# Patient Record
Sex: Male | Born: 1975 | Race: White | Hispanic: No | Marital: Married | Smoking: Current every day smoker
Health system: Southern US, Community
[De-identification: ages and names within clinical notes are randomized; demographics above are authoritative.]

## PROBLEM LIST (undated history)

## (undated) DIAGNOSIS — K219 Gastro-esophageal reflux disease without esophagitis: Secondary | ICD-10-CM

## (undated) DIAGNOSIS — J42 Unspecified chronic bronchitis: Secondary | ICD-10-CM

## (undated) DIAGNOSIS — M109 Gout, unspecified: Secondary | ICD-10-CM

## (undated) HISTORY — DX: Unspecified chronic bronchitis: J42

## (undated) HISTORY — PX: BACK SURGERY: SHX140

---

## 2006-09-09 ENCOUNTER — Emergency Department: Payer: Self-pay | Admitting: Emergency Medicine

## 2008-03-26 ENCOUNTER — Ambulatory Visit: Payer: Self-pay | Admitting: Family Medicine

## 2008-04-27 ENCOUNTER — Ambulatory Visit: Payer: Self-pay | Admitting: Internal Medicine

## 2008-09-26 ENCOUNTER — Ambulatory Visit: Payer: Self-pay | Admitting: Internal Medicine

## 2009-06-01 ENCOUNTER — Ambulatory Visit: Payer: Self-pay | Admitting: Internal Medicine

## 2010-05-11 ENCOUNTER — Ambulatory Visit: Payer: Self-pay | Admitting: Internal Medicine

## 2010-05-16 ENCOUNTER — Ambulatory Visit: Payer: Self-pay | Admitting: Internal Medicine

## 2010-05-18 ENCOUNTER — Ambulatory Visit: Payer: Self-pay | Admitting: Internal Medicine

## 2010-05-23 ENCOUNTER — Ambulatory Visit: Payer: Self-pay | Admitting: Family Medicine

## 2010-06-27 ENCOUNTER — Ambulatory Visit: Payer: Self-pay | Admitting: Internal Medicine

## 2010-06-29 ENCOUNTER — Ambulatory Visit: Payer: Self-pay | Admitting: Internal Medicine

## 2010-07-21 ENCOUNTER — Ambulatory Visit: Payer: Self-pay

## 2010-08-01 ENCOUNTER — Ambulatory Visit: Payer: Self-pay | Admitting: Unknown Physician Specialty

## 2010-08-03 ENCOUNTER — Ambulatory Visit: Payer: Self-pay | Admitting: Unknown Physician Specialty

## 2011-01-27 ENCOUNTER — Ambulatory Visit: Payer: Self-pay

## 2011-06-18 ENCOUNTER — Ambulatory Visit: Payer: Self-pay | Admitting: Unknown Physician Specialty

## 2011-09-29 ENCOUNTER — Ambulatory Visit: Payer: Self-pay | Admitting: Emergency Medicine

## 2011-12-12 ENCOUNTER — Ambulatory Visit: Payer: Self-pay | Admitting: Anesthesiology

## 2011-12-24 ENCOUNTER — Ambulatory Visit: Payer: Self-pay | Admitting: Anesthesiology

## 2012-01-21 ENCOUNTER — Ambulatory Visit: Payer: Self-pay | Admitting: Anesthesiology

## 2012-02-19 ENCOUNTER — Ambulatory Visit: Payer: Self-pay | Admitting: Anesthesiology

## 2012-04-16 ENCOUNTER — Ambulatory Visit: Payer: Self-pay | Admitting: Anesthesiology

## 2012-06-27 ENCOUNTER — Ambulatory Visit: Payer: Self-pay | Admitting: Family Medicine

## 2012-06-28 ENCOUNTER — Emergency Department: Payer: Self-pay | Admitting: Emergency Medicine

## 2012-09-28 ENCOUNTER — Ambulatory Visit: Payer: Self-pay | Admitting: Family Medicine

## 2012-09-28 LAB — URIC ACID: Uric Acid: 9.6 mg/dL — ABNORMAL HIGH (ref 3.5–7.2)

## 2013-03-20 ENCOUNTER — Ambulatory Visit: Payer: Self-pay | Admitting: Unknown Physician Specialty

## 2014-03-20 ENCOUNTER — Ambulatory Visit: Payer: Self-pay | Admitting: Physician Assistant

## 2014-03-20 LAB — CBC WITH DIFFERENTIAL/PLATELET
Basophil #: 0.1 10*3/uL (ref 0.0–0.1)
Basophil %: 1 %
Eosinophil #: 0.5 10*3/uL (ref 0.0–0.7)
Eosinophil %: 4.5 %
HCT: 46 % (ref 40.0–52.0)
HGB: 15.6 g/dL (ref 13.0–18.0)
Lymphocyte #: 1.8 10*3/uL (ref 1.0–3.6)
Lymphocyte %: 17.5 %
MCH: 31.8 pg (ref 26.0–34.0)
MCHC: 33.9 g/dL (ref 32.0–36.0)
MCV: 94 fL (ref 80–100)
Monocyte #: 0.6 x10 3/mm (ref 0.2–1.0)
Monocyte %: 6 %
Neutrophil #: 7.3 10*3/uL — ABNORMAL HIGH (ref 1.4–6.5)
Neutrophil %: 71 %
Platelet: 259 10*3/uL (ref 150–440)
RBC: 4.91 10*6/uL (ref 4.40–5.90)
RDW: 13 % (ref 11.5–14.5)
WBC: 10.2 10*3/uL (ref 3.8–10.6)

## 2014-03-20 LAB — URIC ACID: Uric Acid: 9.1 mg/dL — ABNORMAL HIGH (ref 3.5–7.2)

## 2014-09-20 ENCOUNTER — Other Ambulatory Visit: Payer: Self-pay | Admitting: Orthopedic Surgery

## 2014-09-20 DIAGNOSIS — M5442 Lumbago with sciatica, left side: Secondary | ICD-10-CM

## 2014-09-23 ENCOUNTER — Ambulatory Visit
Admission: RE | Admit: 2014-09-23 | Discharge: 2014-09-23 | Disposition: A | Discharge status: Home / Self Care | Payer: BLUE CROSS/BLUE SHIELD | Source: Ambulatory Visit | Attending: Orthopedic Surgery | Admitting: Orthopedic Surgery

## 2014-09-23 DIAGNOSIS — G544 Lumbosacral root disorders, not elsewhere classified: Secondary | ICD-10-CM | POA: Diagnosis not present

## 2014-09-23 DIAGNOSIS — M5126 Other intervertebral disc displacement, lumbar region: Secondary | ICD-10-CM | POA: Insufficient documentation

## 2014-09-23 DIAGNOSIS — M5432 Sciatica, left side: Secondary | ICD-10-CM | POA: Diagnosis present

## 2014-09-23 DIAGNOSIS — M545 Low back pain: Secondary | ICD-10-CM | POA: Diagnosis present

## 2014-09-23 DIAGNOSIS — M5127 Other intervertebral disc displacement, lumbosacral region: Secondary | ICD-10-CM | POA: Insufficient documentation

## 2014-09-23 DIAGNOSIS — M5442 Lumbago with sciatica, left side: Secondary | ICD-10-CM

## 2014-09-29 ENCOUNTER — Other Ambulatory Visit: Payer: Self-pay | Admitting: Orthopedic Surgery

## 2014-09-29 DIAGNOSIS — M5127 Other intervertebral disc displacement, lumbosacral region: Secondary | ICD-10-CM

## 2014-09-29 DIAGNOSIS — M5442 Lumbago with sciatica, left side: Secondary | ICD-10-CM

## 2014-10-01 ENCOUNTER — Other Ambulatory Visit: Payer: Self-pay

## 2014-10-01 ENCOUNTER — Ambulatory Visit
Admission: RE | Admit: 2014-10-01 | Discharge: 2014-10-01 | Disposition: A | Discharge status: Home / Self Care | Payer: BLUE CROSS/BLUE SHIELD | Source: Ambulatory Visit | Attending: Orthopedic Surgery | Admitting: Orthopedic Surgery

## 2014-10-01 ENCOUNTER — Other Ambulatory Visit: Payer: Self-pay | Admitting: Orthopedic Surgery

## 2014-10-01 VITALS — BP 124/81 | HR 65

## 2014-10-01 DIAGNOSIS — M5126 Other intervertebral disc displacement, lumbar region: Secondary | ICD-10-CM | POA: Insufficient documentation

## 2014-10-01 DIAGNOSIS — M5442 Lumbago with sciatica, left side: Secondary | ICD-10-CM

## 2014-10-01 DIAGNOSIS — Z720 Tobacco use: Secondary | ICD-10-CM

## 2014-10-01 DIAGNOSIS — F17200 Nicotine dependence, unspecified, uncomplicated: Secondary | ICD-10-CM | POA: Insufficient documentation

## 2014-10-01 DIAGNOSIS — M5127 Other intervertebral disc displacement, lumbosacral region: Secondary | ICD-10-CM

## 2014-10-01 DIAGNOSIS — M5136 Other intervertebral disc degeneration, lumbar region: Secondary | ICD-10-CM

## 2014-10-01 MED ORDER — IOHEXOL 180 MG/ML  SOLN
1.0000 mL | Freq: Once | INTRAMUSCULAR | Status: AC | PRN
  Administered 2014-10-01: 1 mL via EPIDURAL

## 2014-10-01 MED ORDER — METHYLPREDNISOLONE ACETATE 40 MG/ML INJ SUSP (RADIOLOG
120.0000 mg | Freq: Once | INTRAMUSCULAR | Status: AC
  Administered 2014-10-01: 120 mg via EPIDURAL

## 2014-10-01 NOTE — Discharge Instructions (Signed)

## 2014-12-13 ENCOUNTER — Ambulatory Visit
Admission: EM | Admit: 2014-12-13 | Discharge: 2014-12-13 | Disposition: A | Discharge status: Home / Self Care | Payer: BLUE CROSS/BLUE SHIELD | Attending: Registered Nurse | Admitting: Registered Nurse

## 2014-12-13 DIAGNOSIS — M10071 Idiopathic gout, right ankle and foot: Secondary | ICD-10-CM

## 2014-12-13 DIAGNOSIS — M109 Gout, unspecified: Secondary | ICD-10-CM

## 2014-12-13 HISTORY — DX: Gout, unspecified: M10.9

## 2014-12-13 MED ORDER — HYDROCODONE-ACETAMINOPHEN 5-325 MG PO TABS: 1.0000 | ORAL_TABLET | Freq: Every evening | ORAL | Status: DC | PRN

## 2014-12-13 MED ORDER — INDOMETHACIN 50 MG PO CAPS: 50.0000 mg | ORAL_CAPSULE | Freq: Three times a day (TID) | ORAL | Status: AC

## 2014-12-13 NOTE — Discharge Instructions (Signed)
Gout Gout is an inflammatory arthritis caused by a buildup of uric acid crystals in the joints. Uric acid is a chemical that is normally present in the blood. When the level of uric acid in the blood is too high it can form crystals that deposit in your joints and tissues. This causes joint redness, soreness, and swelling (inflammation). Repeat attacks are common. Over time, uric acid crystals can form into masses (tophi) near a joint, destroying bone and causing disfigurement. Gout is treatable and often preventable. CAUSES  The disease begins with elevated levels of uric acid in the blood. Uric acid is produced by your body when it breaks down a naturally found substance called purines. Certain foods you eat, such as meats and fish, contain high amounts of purines. Causes of an elevated uric acid level include:  Being passed down from parent to child (heredity).  Diseases that cause increased uric acid production (such as obesity, psoriasis, and certain cancers).  Excessive alcohol use.  Diet, especially diets rich in meat and seafood.  Medicines, including certain cancer-fighting medicines (chemotherapy), water pills (diuretics), and aspirin.  Chronic kidney disease. The kidneys are no longer able to remove uric acid well.  Problems with metabolism. Conditions strongly associated with gout include:  Obesity.  High blood pressure.  High cholesterol.  Diabetes. Not everyone with elevated uric acid levels gets gout. It is not understood why some people get gout and others do not. Surgery, joint injury, and eating too much of certain foods are some of the factors that can lead to gout attacks. SYMPTOMS   An attack of gout comes on quickly. It causes intense pain with redness, swelling, and warmth in a joint.  Fever can occur.  Often, only one joint is involved. Certain joints are more commonly involved:  Base of the big toe.  Knee.  Ankle.  Wrist.  Finger. Without  treatment, an attack usually goes away in a few days to weeks. Between attacks, you usually will not have symptoms, which is different from many other forms of arthritis. DIAGNOSIS  Your caregiver will suspect gout based on your symptoms and exam. In some cases, tests may be recommended. The tests may include:  Blood tests.  Urine tests.  X-rays.  Joint fluid exam. This exam requires a needle to remove fluid from the joint (arthrocentesis). Using a microscope, gout is confirmed when uric acid crystals are seen in the joint fluid. TREATMENT  There are two phases to gout treatment: treating the sudden onset (acute) attack and preventing attacks (prophylaxis).  Treatment of an Acute Attack.  Medicines are used. These include anti-inflammatory medicines or steroid medicines.  An injection of steroid medicine into the affected joint is sometimes necessary.  The painful joint is rested. Movement can worsen the arthritis.  You may use warm or cold treatments on painful joints, depending which works best for you.  Treatment to Prevent Attacks.  If you suffer from frequent gout attacks, your caregiver may advise preventive medicine. These medicines are started after the acute attack subsides. These medicines either help your kidneys eliminate uric acid from your body or decrease your uric acid production. You may need to stay on these medicines for a very long time.  The early phase of treatment with preventive medicine can be associated with an increase in acute gout attacks. For this reason, during the first few months of treatment, your caregiver may also advise you to take medicines usually used for acute gout treatment. Be sure you   understand your caregiver's directions. Your caregiver may make several adjustments to your medicine dose before these medicines are effective.  Discuss dietary treatment with your caregiver or dietitian. Alcohol and drinks high in sugar and fructose and foods  such as meat, poultry, and seafood can increase uric acid levels. Your caregiver or dietitian can advise you on drinks and foods that should be limited. HOME CARE INSTRUCTIONS   Do not take aspirin to relieve pain. This raises uric acid levels.  Only take over-the-counter or prescription medicines for pain, discomfort, or fever as directed by your caregiver.  Rest the joint as much as possible. When in bed, keep sheets and blankets off painful areas.  Keep the affected joint raised (elevated).  Apply warm or cold treatments to painful joints. Use of warm or cold treatments depends on which works best for you.  Use crutches if the painful joint is in your leg.  Drink enough fluids to keep your urine clear or pale yellow. This helps your body get rid of uric acid. Limit alcohol, sugary drinks, and fructose drinks.  Follow your dietary instructions. Pay careful attention to the amount of protein you eat. Your daily diet should emphasize fruits, vegetables, whole grains, and fat-free or low-fat milk products. Discuss the use of coffee, vitamin C, and cherries with your caregiver or dietitian. These may be helpful in lowering uric acid levels.  Maintain a healthy body weight. SEEK MEDICAL CARE IF:   You develop diarrhea, vomiting, or any side effects from medicines.  You do not feel better in 24 hours, or you are getting worse. SEEK IMMEDIATE MEDICAL CARE IF:   Your joint becomes suddenly more tender, and you have chills or a fever. MAKE SURE YOU:   Understand these instructions.  Will watch your condition.  Will get help right away if you are not doing well or get worse. Document Released: 02/24/2000 Document Revised: 07/13/2013 Document Reviewed: 10/10/2011 ExitCare Patient Information 2015 [employer]. This information is not intended to replace advice given to you by your health care provider. Make sure you discuss any questions you have with your health care  provider. Low-Purine Diet Purines are compounds that affect the level of uric acid in your body. A low-purine diet is a diet that is low in purines. Eating a low-purine diet can prevent the level of uric acid in your body from getting too high and causing gout or kidney stones or both. WHAT DO I NEED TO KNOW ABOUT THIS DIET?  Choose low-purine foods. Examples of low-purine foods are listed in the next section.  Drink plenty of fluids, especially water. Fluids can help remove uric acid from your body. Try to drink 8-16 cups (1.9-3.8 L) a day.  Limit foods high in fat, especially saturated fat, as fat makes it harder for the body to get rid of uric acid. Foods high in saturated fat include pizza, cheese, ice cream, whole milk, fried foods, and gravies. Choose foods that are lower in fat and lean sources of protein. Use olive oil when cooking as it contains healthy fats that are not high in saturated fat.  Limit alcohol. Alcohol interferes with the elimination of uric acid from your body. If you are having a gout attack, avoid all alcohol.  Keep in mind that different people's bodies react differently to different foods. You will probably learn over time which foods do or do not affect you. If you discover that a food tends to cause your gout to flare up,   avoid eating that food. You can more freely enjoy foods that do not cause problems. If you have any questions about a food item, talk to your dietitian or health care provider. WHICH FOODS ARE LOW, MODERATE, AND HIGH IN PURINES? The following is a list of foods that are low, moderate, and high in purines. You can eat any amount of the foods that are low in purines. You may be able to have small amounts of foods that are moderate in purines. Ask your health care provider how much of a food moderate in purines you can have. Avoid foods high in purines. Grains  Foods low in purines: Enriched white bread, pasta, rice, cake, cornbread, popcorn.  Foods  moderate in purines: Whole-grain breads and cereals, wheat germ, bran, oatmeal. Uncooked oatmeal. Dry wheat bran or wheat germ.  Foods high in purines: Pancakes, French toast, biscuits, muffins. Vegetables  Foods low in purines: All vegetables, except those that are moderate in purines.  Foods moderate in purines: Asparagus, cauliflower, spinach, mushrooms, green peas. Fruits  All fruits are low in purines. Meats and other Protein Foods  Foods low in purines: Eggs, nuts, peanut butter.  Foods moderate in purines: 80-90% lean beef, lamb, veal, pork, poultry, fish, eggs, peanut butter, nuts. Crab, lobster, oysters, and shrimp. Cooked dried beans, peas, and lentils.  Foods high in purines: Anchovies, sardines, herring, mussels, tuna, codfish, scallops, trout, and haddock. Bacon. Organ meats (such as liver or kidney). Tripe. Game meat. Goose. Sweetbreads. Dairy  All dairy foods are low in purines. Low-fat and fat-free dairy products are best because they are low in saturated fat. Beverages  Drinks low in purines: Water, carbonated beverages, tea, coffee, cocoa.  Drinks moderate in purines: Soft drinks and other drinks sweetened with high-fructose corn syrup. Juices. To find whether a food or drink is sweetened with high-fructose corn syrup, look at the ingredients list.  Drinks high in purines: Alcoholic beverages (such as beer). Condiments  Foods low in purines: Salt, herbs, olives, pickles, relishes, vinegar.  Foods moderate in purines: Butter, margarine, oils, mayonnaise. Fats and Oils  Foods low in purines: All types, except gravies and sauces made with meat.  Foods high in purines: Gravies and sauces made with meat. Other Foods  Foods low in purines: Sugars, sweets, gelatin. Cake. Soups made without meat.  Foods moderate in purines: Meat-based or fish-based soups, broths, or bouillons. Foods and drinks sweetened with high-fructose corn syrup.  Foods high in purines:  High-fat desserts (such as ice cream, cookies, cakes, pies, doughnuts, and chocolate). Contact your dietitian for more information on foods that are not listed here. Document Released: 06/23/2010 Document Revised: 03/03/2013 Document Reviewed: 02/02/2013 ExitCare Patient Information 2015 [employer]. This information is not intended to replace advice given to you by your health care provider. Make sure you discuss any questions you have with your health care provider.  

## 2014-12-13 NOTE — ED Provider Notes (Signed)
CSN: [id number]     Arrival date & time 12/13/14  1112 History   None    Chief Complaint  Patient presents with  . Gout   (Consider location/radiation/quality/duration/timing/severity/associated sxs/prior Treatment) HPI Comments: Married caucasian male here for gout flare right foot/toe.  Last episode Jan 2016 seen by [facility] unsure of what medications he was given but stated they worked well for him.  Left work early today due to pain with steel toe boots on.  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  Denied recent illness/dehydration, couldn't sleep since 0300 today pain work him up.  Tried leftover meloxicam and tylenol from his back surgery today without relief.  The history is provided by the patient and the spouse.    Past Medical History  Diagnosis Date  . Gout    Past Surgical History  Procedure Laterality Date  . Back surgery     History reviewed. No pertinent family history. Social History  Substance Use Topics  . Smoking status: Current Every Day Smoker -- 1.00 packs/day    Types: Cigarettes  . Smokeless tobacco: None  . Alcohol Use: No    Review of Systems  Constitutional: Negative for fever, chills, diaphoresis, activity change, appetite change, fatigue and unexpected weight change.  HENT: Negative for congestion, dental problem, drooling, ear discharge, ear pain, facial swelling, hearing loss, mouth sores, nosebleeds, postnasal drip and trouble swallowing.   Eyes: Negative for photophobia, pain, discharge, redness, itching and visual disturbance.  Respiratory: Negative for cough, choking, chest tightness, shortness of breath, wheezing and stridor.   Cardiovascular: Negative for chest pain, palpitations and leg swelling.  Gastrointestinal: Negative for nausea, vomiting, abdominal pain, diarrhea, constipation, blood in stool, abdominal distention and rectal pain.  Endocrine: Negative for cold intolerance and heat intolerance.    Genitourinary: Negative for dysuria and frequency.  Musculoskeletal: Positive for myalgias, joint swelling, arthralgias and gait problem. Negative for back pain, neck pain and neck stiffness.  Skin: Positive for color change and rash. Negative for pallor and wound.  Allergic/Immunologic: Negative for environmental allergies.  Neurological: Negative for dizziness, tremors, seizures, syncope, facial asymmetry, speech difficulty, weakness, light-headedness, numbness and headaches.  Hematological: Negative for adenopathy. Does not bruise/bleed easily.  Psychiatric/Behavioral: Positive for sleep disturbance. Negative for behavioral problems, confusion and agitation.    Allergies  Ultram  Home Medications   Prior to Admission medications   Medication Sig Start Date End Date Taking? Authorizing Provider  HYDROcodone-acetaminophen (NORCO/VICODIN) 5-325 MG tablet Take 1 tablet by mouth at bedtime as needed for severe pain (avoid alcohol and driving after taking medication). 12/13/14   Barbaraann Barthel, NP  indomethacin (INDOCIN) 50 MG capsule Take 1 capsule (50 mg total) by mouth 3 (three) times daily with meals. 12/13/14 12/19/14  Barbaraann Barthel, NP   Meds Ordered and Administered this Visit  Medications - No data to display  BP 130/92 mmHg  Pulse 78  Temp(Src) 97.7 F (36.5 C) (Oral)  Resp 18  Ht  (1.753 m)  Wt 195 lb (88.451 kg)  BMI 28.78 kg/m2  SpO2 99% No data found.   Physical Exam  Constitutional: He is oriented to person, place, and time. Vital signs are normal. He appears well-developed and well-nourished. No distress.  HENT:  Head: Normocephalic and atraumatic.  Right Ear: External ear normal.  Left Ear: External ear normal.  Nose: Nose normal.  Mouth/Throat: Oropharynx is clear and moist. No oropharyngeal exudate.  Eyes: Conjunctivae, EOM and lids are  normal. Pupils are equal, round, and reactive to light. Right eye exhibits no discharge. Left eye exhibits no  discharge. No scleral icterus.  Neck: Trachea normal and normal range of motion. Neck supple. No tracheal deviation present. No thyromegaly present.  Cardiovascular: Normal rate, regular rhythm, normal heart sounds and intact distal pulses.  Exam reveals no gallop and no friction rub.   No murmur heard. Pulmonary/Chest: Effort normal and breath sounds normal. No stridor. No respiratory distress. He has no wheezes. He has no rales. He exhibits no tenderness.  Abdominal: Soft. Bowel sounds are normal. He exhibits no distension and no mass. There is no tenderness. There is no rebound and no guarding.  Musculoskeletal: He exhibits edema and tenderness.       Right hip: Normal.       Left hip: Normal.       Right knee: Normal.       Left knee: Normal.       Right ankle: He exhibits normal range of motion, no swelling, no ecchymosis, no deformity, no laceration and normal pulse. Tenderness. No lateral malleolus, no medial malleolus, no posterior TFL and no head of 5th metatarsal tenderness found. Achilles tendon normal. Achilles tendon exhibits no pain and no defect.       Left ankle: Normal.       Right upper leg: Normal.       Left upper leg: Normal.       Right lower leg: Normal.       Left lower leg: Normal.       Right foot: There is decreased range of motion, tenderness and swelling. There is no bony tenderness, normal capillary refill, no crepitus, no deformity and no laceration.       Left foot: There is swelling. There is normal range of motion, no tenderness, no bony tenderness, normal capillary refill, no crepitus, no deformity and no laceration.       Feet:  Lymphadenopathy:    He has no cervical adenopathy.  Neurological: He is alert and oriented to person, place, and time. He exhibits normal muscle tone. Coordination normal.  Skin: Skin is warm, dry and intact. Rash noted. No abrasion, no bruising, no burn, no ecchymosis, no laceration, no lesion, no petechiae and no purpura noted.  Rash is macular. Rash is not papular, not maculopapular, not nodular, not pustular, not vesicular and not urticarial. He is not diaphoretic. There is erythema. No cyanosis. No pallor. Nails show no clubbing.     Psychiatric: He has a normal mood and affect. His speech is normal and behavior is normal. Judgment and thought content normal. Cognition and memory are normal.  Nursing note and vitals reviewed.   ED Course  Procedures (including critical care time)  Labs Review Labs Reviewed - No data to display  Imaging Review No results found.  Reviewed [place] Controlled substances website for patient Rx in previous 12 months see below:    Had microdiscectomy in the previous month [facility]  2016 10/20/2014 OXYCODONE- ACETAMINOPHEN 5- 325 [id number] 45 7 0 0 [id number] BLANCHARD CHRISTOPHER S (DO) [place], [place] [id number] [facility] [address],  Guevara, Wahid 13-Oct-1975 [address] [place], [place] [address] 04 0 10/14/2014 10/14/2014 MORPHINE SULF ER 15 MG TABLET [id number] 30 15 0 0 [id number] MENDOZA LATTES Kern Alberta A MD [place], [place] [id number] [place] [facility] [address],  [place], [place] Sep 05, 1975 [address] [place], [place] [address] 04 30 10/13/2014 10/12/2014 OXYCODONE HCL  10 MG TABLET [id number] 40 7 0 0 [id number] [address] MD [place], [place] [id number] [facility] Hassell Halim [place], Akshar 02/23/1976 [address] [place], [place] [address] 04 0 10/06/2014 10/05/2014 OXYCODONE HCL 5 MG TABLET [id number] 60 10 0 0 [id number] [facility] MD [place], [place] [id number] [address] DEROY, NOAH 04-14-1975 Aletta Edouard [place], [place] [address] 04 45 09/28/2014 09/28/2014 OXYCODONE HCL 5 MG TABLET [id number] 40 7 0 0 [id number] MUNDY JONATHAN T [place], [place] [id number] [facility] LORY, GALAN,  Ora 08-Jun-1975 [address] [place], [place] [address] 04 42.86 09/25/2014 09/24/2014 HYDROCODON- ACETAMINOPHEN 5- 325 [id number] 40 10 0 0 [id number] MD [place], [place] [id number] [facility] Bynum Bellows, Ricke 21-Apr-1975 [address] [place], [place] [address] 04 20 09/15/2014 09/15/2014 HYDROCODON- ACETAMINOPHEN 5- 325 [id number] 40 10 0 0 [id number] [facility] MD [place], [place] [id number] [facility] Cheree Ditto [place] HORACE, LUKAS 1975-04-05 [address] [place], [place] [address] 04 20 09/01/2014 08/31/2014 HYDROCODON- ACETAMINOPHEN 5- 325 [id number] 20 5 0 0 [id number] Randa Spike DO [place], [place] [id number] [facility] Cheree Ditto, [place] Reckner, Moustapha [birth year] [address] [place], [place] [address] 04 20 08/26/2014 08/26/2014 TRAMADOL HCL 50 MG TABLET [id number] 20 5 0 0 [id number] LYKINS KIMBERLY G DO [place], [place] [id number] [place] [facility] NAASIR, CARREIRA, Averi 23-Apr-1975 [address] [place], [place] [address] 04 20 03/20/2014 03/20/2014 HYDROCODON- ACETAMINOPH 7.5- 325 [id number] 20 5 0 0 [id number] NICOLE [place], [place] [id number] [place] [facility] [place], [place] Tirrell, Aland 1975/08/20 [address] [place], [place] [address]     MDM   1. Acute gout of right foot, unspecified cause    Saw PA Maurice March Jan 2016 for last flare treated with indocin and norco with good relief.   Discussed with patient norco primarily for bedtime. Take indomethacin  po TID on regular schedule.  Hydrate.  Work excuse x 48 hours today and tomorrow. Avoid alcohol and driving for 8 hours after taking norco as may cause drowsiness avoid operating dangerous equipment after taking norco also.  Keep foot elevated may apply ice pack.   Patient has been partly following low purine diet stopped beer but not red meat.  Discussed  pathophysiology of gout with patient.   Exitcare handout on gout given to patient and discussed foods to avoid:  beer, hard liquor, meats, seafood, high corn fructose beverages/food items.   Follow up with [facility] for annual preventive visit consider preventive therapy if third flare in one year.  Wife and  Patient verbalized understanding of instructions, agreed with plan of care and had no further questions at this time. P2:  low purine diet    Barbaraann Barthel, NP 12/14/14 1402

## 2014-12-13 NOTE — ED Notes (Signed)
Hx of Gout. Does not have a PMD. States started at 3am today with pain and redness at base of right great toe

## 2014-12-13 NOTE — ED Notes (Addendum)
States woke at 0300 hrs. Today with gout base of right great toe. + redness and swelling. Hx of gout. States also needs a work note

## 2014-12-17 NOTE — Progress Notes (Signed)
Contacted patient via telephone.  Swelling left foot had improved but upon returning to regular work hours worsening swelling and pain.  Discussed with patient would need to reassess him open until 8pm today or open 8am tomorrow.  Patient stated he would be in to see me within the next 24 hours.  He gets off work at [employer] or may come in the morning.  Denied fever, chills, nausea, vomiting,diarrhea or rash.  Having foot and ankle pain/swelling left prefers to wear crocs at home but has to wear steel toe boots at work.

## 2014-12-26 ENCOUNTER — Ambulatory Visit
Admission: EM | Admit: 2014-12-26 | Discharge: 2014-12-26 | Disposition: A | Discharge status: Home / Self Care | Payer: BLUE CROSS/BLUE SHIELD | Attending: Family Medicine | Admitting: Family Medicine

## 2014-12-26 ENCOUNTER — Ambulatory Visit: Payer: BLUE CROSS/BLUE SHIELD

## 2014-12-26 DIAGNOSIS — M10071 Idiopathic gout, right ankle and foot: Secondary | ICD-10-CM | POA: Diagnosis not present

## 2014-12-26 DIAGNOSIS — M109 Gout, unspecified: Secondary | ICD-10-CM

## 2014-12-26 MED ORDER — NAPROXEN 500 MG PO TABS: 500.0000 mg | ORAL_TABLET | Freq: Two times a day (BID) | ORAL | Status: DC

## 2014-12-26 NOTE — Discharge Instructions (Signed)

## 2014-12-26 NOTE — ED Notes (Signed)
Patient states that he was seen here a few weeks ago by Inetta Fermoina and treated for Gout. He was given Indomethacin. Patient states that foot is worse now. He states that pain is worse in the evening.

## 2014-12-26 NOTE — ED Provider Notes (Signed)
CSN: [id number]     Arrival date & time 12/26/14  1112 History   First MD Initiated Contact with Patient 12/26/14 1242     Chief Complaint  Patient presents with  . 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 At that time he was given indomethacin 50 mg 3 times a day. He states that it improved for a while it is worse now. It even is affecting his other toes and the little toe feels like it's more painful underneath. Previous uric acids were 9.6 level is to have been placed on allopurinol the past. He is trying to maintain a good diet and denies drinking any alcoholic beverages or using meat or seafood to excess. This is third episode since January. He does not have a primary care physician, orthopedic surgeon, or rheumatologist.  Past Medical History  Diagnosis Date  . Gout    Past Surgical History  Procedure Laterality Date  . Back surgery     No family history on file. Social History  Substance Use Topics  . Smoking status: Current Every Day Smoker -- 1.00 packs/day for 20 years    Types: Cigarettes  . Smokeless tobacco: None  . Alcohol Use: No    Review of Systems  Constitutional: Positive for activity change. Negative for fever, chills, fatigue and unexpected weight change.  Musculoskeletal: Positive for joint swelling, arthralgias and gait problem.  All other systems reviewed and are negative.   Allergies  Ultram  Home Medications   Prior to Admission medications   Medication Sig Start Date End Date Taking? Authorizing Provider  HYDROcodone-acetaminophen (NORCO/VICODIN) 5-325 MG tablet Take 1 tablet by mouth at bedtime as needed for severe pain (avoid alcohol and driving after taking medication). 12/13/14   Barbaraann Barthelina A Betancourt, NP  naproxen (NAPROSYN) 500 MG tablet Take 1 tablet (500 mg total) by mouth 2 (two) times daily with a  meal. 12/26/14   Lutricia FeilWilliam P Jonael Paradiso, PA-C   Meds Ordered and Administered this Visit  Medications - No data to display  BP 126/93 mmHg  Pulse 96  Temp(Src) 97.3 F (36.3 C) (Tympanic)  Resp 17  Ht 5\' 9"  (1.753 m)  Wt 195 lb (88.451 kg)  BMI 28.78 kg/m2  SpO2 96% No data found.   Physical Exam  Constitutional: He is oriented to person, place, and time. He appears well-developed and well-nourished. No distress.  HENT:  Head: Normocephalic and atraumatic.  Eyes: Pupils are equal, round, and reactive to light.  Neck: Neck supple.  Musculoskeletal:  Examination of the right foot is warm erythematous and swollen hallux MTP and less so over the second through fifth toes. Is tenderness at the MP joints 2 through 5. Very little range of motion without pain.  Neurological: He is alert and oriented to person, place, and time.  Skin: Skin is warm and dry. He is not diaphoretic. There is erythema.  Psychiatric: He has a normal mood and affect. His behavior is normal. Judgment and thought content normal.  Nursing note and vitals reviewed.   ED Course  Procedures (including critical care time)  Labs Review Labs Reviewed - No data to display  Imaging Review No results found.   Visual Acuity Review  Right Eye Distance:   Left Eye Distance:   Bilateral Distance:    Right Eye Near:   Left Eye Near:    Bilateral Near:  MDM   1. Acute gout of right foot, unspecified cause    New Prescriptions   NAPROXEN (NAPROSYN) 500 MG TABLET    Take 1 tablet (500 mg total) by mouth 2 (two) times daily with a meal.  Plan: 1. Test/x-ray results and diagnosis reviewed with patient 2. rx as per orders; risks, benefits, potential side effects reviewed with patient 3. Recommend supportive treatment with Ice and rest. Limit walking until improved 4. F/u  Dr. Hollace Hayward for continued care. Possible placement on allopurinol.   Lutricia Feil, PA-C 12/26/14 1358

## 2014-12-30 ENCOUNTER — Telehealth: Payer: Self-pay

## 2014-12-30 NOTE — ED Notes (Signed)
Girlfriend Meriam SpragueBeverly called [facility] stating patient in severe pain and that Naprosyn not working that was prescribed on Sunday 12/26/14. This nurse spoke with patient and then updated Colon FlatteryBill Roemer PA re: increased/worsening pain. This nurse to [facility], Indocin 50 mg. Take 1 po tid with food, #30 no refills. Also Medrol Dose Pack Dispense #1, No refills-use per package instructions. Also patient informed that needs to follow up with Orthopedic or Rheumatologist. Patient verbalized understanding

## 2015-01-25 ENCOUNTER — Ambulatory Visit (INDEPENDENT_AMBULATORY_CARE_PROVIDER_SITE_OTHER): Payer: BLUE CROSS/BLUE SHIELD | Admitting: Family Medicine

## 2015-01-25 ENCOUNTER — Encounter: Payer: Self-pay | Admitting: Family Medicine

## 2015-01-25 VITALS — BP 124/80 | HR 64 | Temp 97.9°F | Ht 69.1 in | Wt 195.0 lb

## 2015-01-25 DIAGNOSIS — M10071 Idiopathic gout, right ankle and foot: Secondary | ICD-10-CM | POA: Diagnosis not present

## 2015-01-25 DIAGNOSIS — Z13220 Encounter for screening for lipoid disorders: Secondary | ICD-10-CM | POA: Diagnosis not present

## 2015-01-25 DIAGNOSIS — M109 Gout, unspecified: Secondary | ICD-10-CM | POA: Insufficient documentation

## 2015-01-25 DIAGNOSIS — Z720 Tobacco use: Secondary | ICD-10-CM

## 2015-01-25 DIAGNOSIS — E663 Overweight: Secondary | ICD-10-CM

## 2015-01-25 DIAGNOSIS — Z113 Encounter for screening for infections with a predominantly sexual mode of transmission: Secondary | ICD-10-CM | POA: Diagnosis not present

## 2015-01-25 DIAGNOSIS — Z23 Encounter for immunization: Secondary | ICD-10-CM | POA: Diagnosis not present

## 2015-01-25 LAB — UA/M W/RFLX CULTURE, ROUTINE
Bilirubin, UA: NEGATIVE
Glucose, UA: NEGATIVE
Ketones, UA: NEGATIVE
Leukocytes, UA: NEGATIVE
Nitrite, UA: NEGATIVE
Protein, UA: NEGATIVE
RBC, UA: NEGATIVE
Specific Gravity, UA: 1.02 (ref 1.005–1.030)
Urobilinogen, Ur: 0.2 mg/dL (ref 0.2–1.0)
pH, UA: 6.5 (ref 5.0–7.5)

## 2015-01-25 MED ORDER — COLCHICINE 0.6 MG PO TABS: ORAL_TABLET | ORAL | Status: DC

## 2015-01-25 NOTE — Assessment & Plan Note (Signed)
Checking labs today. Encouraged patient to quit smoking.

## 2015-01-25 NOTE — Progress Notes (Signed)
BP 124/80 mmHg  Pulse 64  Temp(Src) 97.9 F (36.6 C)  Ht 5' 9.1" (1.755 m)  Wt 195 lb (88.451 kg)  BMI 28.72 kg/m2  SpO2 96%   Subjective:    Patient ID: Ethan George, male    DOB: 1975-09-11, 39 y.o.   MRN: [id number]  HPI: Ethan George is a 39 y.o. male  Chief Complaint  Patient presents with  . Gout    Patient states that he is having pain in his right big toe, he states that it will usually go away, but it has not went away yet.   GOUT- has been struggling with it for years. It comes and goes. He usually gets it when it starts to get cold and he is able to get it under control with 1 course of medication. Has been happening more often recently and they told him at the walk-in that he needs to see a PCP to get on prophylactic medicine. Has not ever been on prophylactic medicine in the past Duration:chronic Right 1st metatarsophalangeal pain: yes Left 1st metatarsophalangeal pain: yes- previously Right knee pain: no Left knee pain: no Severity: severe  Quality: dull and aching Swelling: yes Redness: yes Trauma: no Recent dietary change or indiscretion: no Fevers: no Nausea/vomiting: no Aggravating factors: standing on the concrete Alleviating factors: ice and elevation Status:  stable Treatments attempted:  Relevant past medical, surgical, family and social history reviewed and updated as indicated. Interim medical history since our last visit reviewed. Allergies and medications reviewed and updated. Please see appropriate areas of epic for full discussion of past medical, surgical, family and social history.  Review of Systems  Constitutional: Negative.   Respiratory: Negative.   Cardiovascular: Negative.   Musculoskeletal: Positive for arthralgias.  Psychiatric/Behavioral: Negative.    Per HPI unless specifically indicated above     Objective:    BP 124/80 mmHg  Pulse 64  Temp(Src) 97.9 F (36.6 C)  Ht 5' 9.1" (1.755 m)  Wt 195 lb (88.451  kg)  BMI 28.72 kg/m2  SpO2 96%  Wt Readings from Last 3 Encounters:  01/25/15 195 lb (88.451 kg)  12/26/14 195 lb (88.451 kg)  12/13/14 195 lb (88.451 kg)    Physical Exam  Constitutional: He is oriented to person, place, and time. He appears well-developed and well-nourished. No distress.  HENT:  Head: Normocephalic and atraumatic.  Right Ear: Hearing normal.  Left Ear: Hearing normal.  Nose: Nose normal.  Eyes: Conjunctivae and lids are normal. Right eye exhibits no discharge. Left eye exhibits no discharge. No scleral icterus.  Cardiovascular: Normal rate, normal heart sounds and intact distal pulses.  Exam reveals no gallop and no friction rub.   No murmur heard. Pulmonary/Chest: Effort normal and breath sounds normal. No respiratory distress. He has no wheezes. He has no rales. He exhibits no tenderness.  Musculoskeletal: Normal range of motion. He exhibits edema and tenderness.  Red swollen and very tender MTP joint on the R great toe  Neurological: He is alert and oriented to person, place, and time.  Skin: Skin is warm, dry and intact. No rash noted. No erythema. No pallor.  Psychiatric: He has a normal mood and affect. His speech is normal and behavior is normal. Judgment and thought content normal. Cognition and memory are normal.  Nursing note and vitals reviewed.   Results for orders placed or performed in visit on 03/20/14  CBC with Differential/Platelet  Result Value Ref Range   WBC 10.2 3.8-10.6 x10  3/mm 3   RBC 4.91 4.40-5.90 x10 6/mm 3   HGB 15.6 13.0-18.0 g/dL   HCT 57.846.0 46.9-62.940.0-52.0 %   MCV 94 80-100 fL   MCH 31.8 26.0-34.0 pg   MCHC 33.9 32.0-36.0 g/dL   RDW 52.813.0 41.3-24.411.5-14.5 %   Platelet 259 150-440 x10 3/mm 3   Neutrophil % 71.0 %   Lymphocyte % 17.5 %   Monocyte % 6.0 %   Eosinophil % 4.5 %   Basophil % 1.0 %   Neutrophil # 7.3 (H) 1.4-6.5 x10 3/mm 3   Lymphocyte # 1.8 1.0-3.6 x10 3/mm 3   Monocyte # 0.6 0.2-1.0 x10 3/mm    Eosinophil # 0.5 0.0-0.7 x10  3/mm 3   Basophil # 0.1 0.0-0.1 x10 3/mm 3  Uric acid  Result Value Ref Range   Uric Acid 9.1 (H) 3.5-7.2 mg/dL      Assessment & Plan:   Problem List Items Addressed This Visit      Other   Current tobacco use    Checking labs today. Encouraged patient to quit smoking.      Relevant Orders   UA/M w/rflx Culture, Routine   Gout    Will treat acute gout flare today with colcrys. Will call tomorrow and see how he's doing. If better, will start allopurinol with colcrys in 2 days. If not, will repeat colcrys after 3 days. Await lab results. Monitor closely. Follow up in 1 month when on the allopurinol.      Relevant Orders   CBC with Differential/Platelet   Comprehensive metabolic panel   Uric acid    Other Visit Diagnoses    Immunization due    -  Primary    Tdap given today. Flu refused.    Relevant Orders    Tdap vaccine greater than or equal to 7yo IM (Completed)    Screening for STD (sexually transmitted disease)        No concern. Will check HIV today. Await results.     Relevant Orders    HIV antibody    Overweight        Checking labs today. Await results.     Relevant Orders    TSH    Screening for cholesterol level        Checking labs today. Await results.     Relevant Orders    Lipid Panel w/o Chol/HDL Ratio        Follow up plan: Return in about 4 weeks (around 02/22/2015) for follow up gout.

## 2015-01-25 NOTE — Assessment & Plan Note (Signed)
Will treat acute gout flare today with colcrys. Will call tomorrow and see how he's doing. If better, will start allopurinol with colcrys in 2 days. If not, will repeat colcrys after 3 days. Await lab results. Monitor closely. Follow up in 1 month when on the allopurinol.

## 2015-01-26 ENCOUNTER — Telehealth: Payer: Self-pay | Admitting: Family Medicine

## 2015-01-26 LAB — CBC WITH DIFFERENTIAL/PLATELET
Basophils Absolute: 0 10*3/uL (ref 0.0–0.2)
Basos: 0 %
EOS (ABSOLUTE): 0.3 10*3/uL (ref 0.0–0.4)
Eos: 3 %
Hematocrit: 41 % (ref 37.5–51.0)
Hemoglobin: 13.7 g/dL (ref 12.6–17.7)
Immature Grans (Abs): 0 10*3/uL (ref 0.0–0.1)
Immature Granulocytes: 0 %
Lymphocytes Absolute: 1.7 10*3/uL (ref 0.7–3.1)
Lymphs: 21 %
MCH: 31 pg (ref 26.6–33.0)
MCHC: 33.4 g/dL (ref 31.5–35.7)
MCV: 93 fL (ref 79–97)
Monocytes Absolute: 0.6 10*3/uL (ref 0.1–0.9)
Monocytes: 7 %
Neutrophils Absolute: 5.5 10*3/uL (ref 1.4–7.0)
Neutrophils: 69 %
Platelets: 284 10*3/uL (ref 150–379)
RBC: 4.42 x10E6/uL (ref 4.14–5.80)
RDW: 13.8 % (ref 12.3–15.4)
WBC: 8.1 10*3/uL (ref 3.4–10.8)

## 2015-01-26 LAB — COMPREHENSIVE METABOLIC PANEL
ALT: 36 IU/L (ref 0–44)
AST: 27 IU/L (ref 0–40)
Albumin/Globulin Ratio: 2 (ref 1.1–2.5)
Albumin: 4.6 g/dL (ref 3.5–5.5)
Alkaline Phosphatase: 78 IU/L (ref 39–117)
BUN/Creatinine Ratio: 9 (ref 8–19)
BUN: 8 mg/dL (ref 6–20)
Bilirubin Total: 0.4 mg/dL (ref 0.0–1.2)
CO2: 25 mmol/L (ref 18–29)
Calcium: 9.5 mg/dL (ref 8.7–10.2)
Chloride: 99 mmol/L (ref 97–106)
Creatinine, Ser: 0.93 mg/dL (ref 0.76–1.27)
GFR calc Af Amer: 121 mL/min/{1.73_m2} (ref >59)
GFR calc non Af Amer: 105 mL/min/{1.73_m2} (ref >59)
Globulin, Total: 2.3 g/dL (ref 1.5–4.5)
Glucose: 91 mg/dL (ref 65–99)
Potassium: 4.6 mmol/L (ref 3.5–5.2)
Sodium: 139 mmol/L (ref 136–144)
Total Protein: 6.9 g/dL (ref 6.0–8.5)

## 2015-01-26 LAB — URIC ACID: Uric Acid: 9.6 mg/dL — ABNORMAL HIGH (ref 3.7–8.6)

## 2015-01-26 LAB — LIPID PANEL W/O CHOL/HDL RATIO
Cholesterol, Total: 225 mg/dL — ABNORMAL HIGH (ref 100–199)
HDL: 34 mg/dL — ABNORMAL LOW (ref >39)
LDL Calculated: 137 mg/dL — ABNORMAL HIGH (ref 0–99)
Triglycerides: 271 mg/dL — ABNORMAL HIGH (ref 0–149)
VLDL Cholesterol Cal: 54 mg/dL — ABNORMAL HIGH (ref 5–40)

## 2015-01-26 LAB — HIV ANTIBODY (ROUTINE TESTING W REFLEX): HIV Screen 4th Generation wRfx: NONREACTIVE

## 2015-01-26 LAB — TSH: TSH: 1.51 u[IU]/mL (ref 0.450–4.500)

## 2015-01-26 NOTE — Telephone Encounter (Signed)
Doing better. Less pain. Foot still puffed up. Will check in with him on Monday to see how he's doing. Gave him the results of his blood work. Nice and normal except elevated cholesterol and uric acid. Work on cutting out fried and fatty foods and we'll keep working on the gout.

## 2015-01-31 ENCOUNTER — Telehealth: Payer: Self-pay | Admitting: Family Medicine

## 2015-01-31 DIAGNOSIS — M10071 Idiopathic gout, right ankle and foot: Secondary | ICD-10-CM

## 2015-01-31 MED ORDER — ALLOPURINOL 100 MG PO TABS: ORAL_TABLET | ORAL | Status: DC

## 2015-01-31 NOTE — Telephone Encounter (Signed)
He is feeling much better. Foot has resolved. Having issues with his sinuses- needs an appointment, transferred to front desk to get that booked.  Will continue cochicine daily for the next week to avoid flare. Will start allopurinol and recheck uric acid in 2 weeks.

## 2015-01-31 NOTE — Telephone Encounter (Signed)
-----   Message from Dorcas CarrowMegan P Johnson, [place]DO sent at 01/26/2015 10:01 AM EST ----- Call and check in how his foot is doing and adjust gout medications.

## 2015-02-22 ENCOUNTER — Ambulatory Visit: Payer: BLUE CROSS/BLUE SHIELD | Admitting: Family Medicine

## 2015-12-03 ENCOUNTER — Emergency Department
Admission: EM | Admit: 2015-12-03 | Discharge: 2015-12-03 | Disposition: A | Discharge status: Home / Self Care | Payer: BLUE CROSS/BLUE SHIELD | Attending: Emergency Medicine | Admitting: Emergency Medicine

## 2015-12-03 ENCOUNTER — Encounter: Payer: Self-pay | Admitting: Emergency Medicine

## 2015-12-03 ENCOUNTER — Emergency Department: Payer: BLUE CROSS/BLUE SHIELD

## 2015-12-03 DIAGNOSIS — Z79899 Other long term (current) drug therapy: Secondary | ICD-10-CM | POA: Insufficient documentation

## 2015-12-03 DIAGNOSIS — K219 Gastro-esophageal reflux disease without esophagitis: Secondary | ICD-10-CM | POA: Insufficient documentation

## 2015-12-03 DIAGNOSIS — F17210 Nicotine dependence, cigarettes, uncomplicated: Secondary | ICD-10-CM | POA: Insufficient documentation

## 2015-12-03 DIAGNOSIS — J209 Acute bronchitis, unspecified: Secondary | ICD-10-CM

## 2015-12-03 DIAGNOSIS — Z791 Long term (current) use of non-steroidal anti-inflammatories (NSAID): Secondary | ICD-10-CM | POA: Insufficient documentation

## 2015-12-03 DIAGNOSIS — Z720 Tobacco use: Secondary | ICD-10-CM

## 2015-12-03 MED ORDER — ALBUTEROL SULFATE (2.5 MG/3ML) 0.083% IN NEBU
2.5000 mg | INHALATION_SOLUTION | Freq: Once | RESPIRATORY_TRACT | Status: AC
  Administered 2015-12-03: 2.5 mg via RESPIRATORY_TRACT
  Filled 2015-12-03: qty 3

## 2015-12-03 MED ORDER — PROMETHAZINE-DM 6.25-15 MG/5ML PO SYRP: 5.0000 mL | ORAL_SOLUTION | Freq: Four times a day (QID) | ORAL | 0 refills | Status: DC | PRN

## 2015-12-03 MED ORDER — AZITHROMYCIN 250 MG PO TABS: ORAL_TABLET | ORAL | 0 refills | Status: DC

## 2015-12-03 MED ORDER — RANITIDINE HCL 150 MG PO TABS: 150.0000 mg | ORAL_TABLET | Freq: Two times a day (BID) | ORAL | 0 refills | Status: DC

## 2015-12-03 MED ORDER — PREDNISONE 10 MG PO TABS: ORAL_TABLET | ORAL | 0 refills | Status: DC

## 2015-12-03 MED ORDER — ALBUTEROL SULFATE HFA 108 (90 BASE) MCG/ACT IN AERS: 1.0000 | INHALATION_SPRAY | Freq: Four times a day (QID) | RESPIRATORY_TRACT | 0 refills | Status: DC | PRN

## 2015-12-03 NOTE — ED Provider Notes (Signed)
[facility] Emergency Department Provider Note  ____________________________________________  Time seen: Approximately 10:46 AM  I have reviewed the triage vital signs and the nursing notes.   HISTORY  Chief Complaint Cough    HPI Ethan George is a 40 y.o. male , NAD, presents to the emergency department with 1 week history of cough and chest congestion. Patient states he has had productive cough, chest congestion and wheezing for approximately 7 days. Has had associated nasal congestion, runny nose and ear pressure. Notes that he's had onset of right lower chest pain with the cough over the last couple of days. He is accompanied by his significant other who notes that he wheezes at night. Patient does have a 48-pack-year smoking history as he smokes one and half to 2 packs of cigarettes per day since the age of 49. States he wakes up most mornings with a cough that is productive of thick sputum. Also notes that he has increase in acid reflux in which he can vomit bile after eating. Has associated heartburn with these episodes. States it worsens if he eats prior to bed time as she can wake up with reflux symptoms. Has been taking over-the-counter Pepcid without relief of symptoms. Has not seen any blood in his sputum or emesis. Denies any abdominal pain or nausea. Has had no shortness breath, headaches, numbness, weakness, tingling. Has not had any fevers, chills, body aches. No history of asthma but states he has had breathing treatments in the past when he sat upper respiratory infections.   Past Medical History:  Diagnosis Date  . Gout     Patient Active Problem List   Diagnosis Date Noted  . Gout   . 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 Current tobacco use 10/01/2014    Past Surgical History:  Procedure Laterality Date  . BACK SURGERY  2012,2016   X 2    Prior to Admission medications   Medication Sig Start Date End Date  Taking? Authorizing Provider  albuterol (PROVENTIL HFA;VENTOLIN HFA) 108 (90 Base) MCG/ACT inhaler Inhale 1-2 puffs into the lungs every 6 (six) hours as needed for wheezing or shortness of breath. 12/03/15   Kamaal Cast L Marea Reasner, PA-C  allopurinol (ZYLOPRIM) 100 MG tablet 1 tab daily for 1 week, then increase to 2 tabs daily for 1 week. Return for recheck blood work on uric acid in 2 weeks. 01/31/15   Megan P Johnson, DO  azithromycin (ZITHROMAX Z-PAK) 250 MG tablet Take 2 tablets (500 mg) on  Day 1,  followed by 1 tablet (250 mg) once daily on Days 2 through 5. 12/03/15   Deliana Avalos L Shalee Paolo, PA-C  colchicine 0.6 MG tablet 2 tabs now. Then 1 tab 1 hour later. May repeat after 3 days if not better. 12 hours after better, 1 tab BID 01/25/15   Megan P Johnson, DO  naproxen (NAPROSYN) 500 MG tablet Take 1 tablet (500 mg total) by mouth 2 (two) times daily with a meal. 12/26/14   Lutricia Feil, PA-C  predniSONE (DELTASONE) 10 MG tablet Take a daily regimen of 6,5,4,3,2,1 12/03/15   Jaymi Tinner L Malikai Gut, PA-C  promethazine-dextromethorphan (PROMETHAZINE-DM) 6.25-15 MG/5ML syrup Take 5 mLs by mouth 4 (four) times daily as needed for cough. 12/03/15   Ly Wass L Isaias Dowson, PA-C  ranitidine (ZANTAC) 150 MG tablet Take 1 tablet (150 mg total) by mouth 2 (two) times daily. 12/03/15 01/02/16  Dontarius Sheley L Shaheim Mahar, PA-C    Allergies Ultram [tramadol hcl]  Family  History  Problem Relation Age of Onset  . Heart attack Maternal Grandfather     Social History Social History  Substance Use Topics  . Smoking status: Current Every Day Smoker    Packs/day: 1.50    Years: 20.00    Types: Cigarettes  . Smokeless tobacco: Never Used  . Alcohol use No     Review of Systems  Constitutional: No fever/chills ENT: Positive ear pressure, nasal congestion, runny nose. No sinus pressure. Cardiovascular: No chest pain. Respiratory: Positive productive cough, chest congestion and wheezing at night. No shortness of breath. Gastrointestinal:  Positive intermittent emesis of bile with associated heartburn. No hematemesis or melena. No abdominal pain.  No nausea.  No diarrhea.   Musculoskeletal: Positive right anterior lower chest wall pain. Negative for back, neck pain.  Skin: Negative for rash. Neurological: Negative for headaches, focal weakness or numbness. No tingling. 10-point ROS otherwise negative.  ____________________________________________   PHYSICAL EXAM:  VITAL SIGNS: ED Triage Vitals  Enc Vitals Group     BP 12/03/15 0947 120/72     Pulse Rate 12/03/15 0947 85     Resp 12/03/15 0947 18     Temp 12/03/15 0947 97.6 F (36.4 C)     Temp Source 12/03/15 0947 Oral     SpO2 12/03/15 0947 95 %     Weight 12/03/15 0948 200 lb (90.7 kg)     Height 12/03/15 0948 5\' 9"  (1.753 m)     Head Circumference --      Peak Flow --      Pain Score 12/03/15 0948 5     Pain Loc --      Pain Edu? --      Excl. in GC? --      Constitutional: Alert and oriented. Well appearing and in no acute distress.Heavy odor of tobacco. Eyes: Conjunctivae are normal Without icterus or injection. Head: Atraumatic. ENT:      Ears: TMs visualized bilaterally with mild bulging and trace serous effusion but no erythema or perforation      Nose: Moderate congestion with clear rhinorrhea. Bilateral turbinates injected.      Mouth/Throat: Mucous membranes are moist. Airways pain. Clear postnasal drip. Uvula is midline. Pharynx without erythema, swelling, exudate. Neck: Supple with full range of motion. Hematological/Lymphatic/Immunilogical: No cervical lymphadenopathy. Cardiovascular: Normal rate, regular rhythm. Normal S1 and S2.  Good peripheral circulation. Respiratory: Normal respiratory effort without tachypnea or retractions. Coarse breath sounds throughout bilateral lung fields that clear with hard cough. Trace wheeze is noted in bilateral lung fields that has significantly improved with albuterol nebulized solution.  Neurologic:  Normal  speech and language. No gross focal neurologic deficits are appreciated.  Skin:  Skin is warm, dry and intact. No rash noted. Psychiatric: Mood and affect are normal. Speech and behavior are normal. Patient exhibits appropriate insight and judgement.   ____________________________________________   LABS  None ____________________________________________  EKG  None ____________________________________________  RADIOLOGY I have personally viewed and evaluated these images (plain radiographs) as part of my medical decision making, as well as reviewing the written report by the radiologist.  Dg Chest 2 View  Result Date: 12/03/2015 CLINICAL DATA:  Intermittent fever for the past week. Upper respiratory tract infection. Smoker. EXAM: CHEST  2 VIEW COMPARISON:  06/28/2012. FINDINGS: Normal sized heart. Clear lungs. Minimal thoracic spine degenerative changes. IMPRESSION: No acute abnormality. Electronically Signed   By: Beckie SaltsSteven  Reid M.D.   On: 12/03/2015 10:24    ____________________________________________    PROCEDURES  Procedure(s) performed: None   Procedures   Medications  albuterol (PROVENTIL) (2.5 MG/3ML) 0.083% nebulizer solution 2.5 mg (2.5 mg Nebulization Given 12/03/15 1050)     ____________________________________________   INITIAL IMPRESSION / ASSESSMENT AND PLAN / ED COURSE  Pertinent labs & imaging results that were available during my care of the patient were reviewed by me and considered in my medical decision making (see chart for details).  Clinical Course    Patient's diagnosis is consistent with Acute bronchitis, gastroesophageal reflux disease and tobacco abuse. Patient will be discharged home with prescriptions for albuterol inhaler, Z-Pak, prednisone, promethazine DM syrup and Zantac to take as directed. Patient is advised to follow up with [facility] in 48-72 hours if not improving as well as to establish care for primary care services.  Patient is also encouraged to quit smoking and was given exit care in regards to such. Patient is given ED precautions to return to the ED for any worsening or new symptoms.    ____________________________________________  FINAL CLINICAL IMPRESSION(S) / ED DIAGNOSES  Final diagnoses:  Acute bronchitis, unspecified organism  Tobacco abuse  Gastroesophageal reflux disease, esophagitis presence not specified      NEW MEDICATIONS STARTED DURING THIS VISIT:  New Prescriptions   ALBUTEROL (PROVENTIL HFA;VENTOLIN HFA) 108 (90 BASE) MCG/ACT INHALER    Inhale 1-2 puffs into the lungs every 6 (six) hours as needed for wheezing or shortness of breath.   AZITHROMYCIN (ZITHROMAX Z-PAK) 250 MG TABLET    Take 2 tablets (500 mg) on  Day 1,  followed by 1 tablet (250 mg) once daily on Days 2 through 5.   PREDNISONE (DELTASONE) 10 MG TABLET    Take a daily regimen of 6,5,4,3,2,1   PROMETHAZINE-DEXTROMETHORPHAN (PROMETHAZINE-DM) 6.25-15 MG/5ML SYRUP    Take 5 mLs by mouth 4 (four) times daily as needed for cough.   RANITIDINE (ZANTAC) 150 MG TABLET    Take 1 tablet (150 mg total) by mouth 2 (two) times daily.         Hope Pigeon, PA-C 12/03/15 1138    Governor Rooks, MD 12/03/15 [phone]

## 2015-12-03 NOTE — ED Triage Notes (Signed)
Cough x 1 week, R chest wall pain with inspiration and movement.

## 2015-12-03 NOTE — ED Notes (Signed)
Patient reports sharp pain under his right ribs since Thursday night. Patient states that the pain was sudden onset, comes and goes, gets worse when he moves a certain way or when he takes a deep breath. Patient has been sick with a cold recently. Cough still present. Patient also c/o nasal congestion.

## 2016-02-26 ENCOUNTER — Emergency Department
Admission: EM | Admit: 2016-02-26 | Discharge: 2016-02-26 | Disposition: A | Discharge status: Home / Self Care | Payer: Self-pay | Attending: Emergency Medicine | Admitting: Emergency Medicine

## 2016-02-26 ENCOUNTER — Emergency Department: Payer: Self-pay

## 2016-02-26 ENCOUNTER — Encounter: Payer: Self-pay | Admitting: Emergency Medicine

## 2016-02-26 DIAGNOSIS — R509 Fever, unspecified: Secondary | ICD-10-CM | POA: Insufficient documentation

## 2016-02-26 DIAGNOSIS — R0602 Shortness of breath: Secondary | ICD-10-CM | POA: Insufficient documentation

## 2016-02-26 DIAGNOSIS — R51 Headache: Secondary | ICD-10-CM | POA: Insufficient documentation

## 2016-02-26 DIAGNOSIS — R1110 Vomiting, unspecified: Secondary | ICD-10-CM | POA: Insufficient documentation

## 2016-02-26 DIAGNOSIS — R1013 Epigastric pain: Secondary | ICD-10-CM | POA: Insufficient documentation

## 2016-02-26 DIAGNOSIS — Z79899 Other long term (current) drug therapy: Secondary | ICD-10-CM | POA: Insufficient documentation

## 2016-02-26 DIAGNOSIS — J029 Acute pharyngitis, unspecified: Secondary | ICD-10-CM | POA: Insufficient documentation

## 2016-02-26 DIAGNOSIS — R6889 Other general symptoms and signs: Secondary | ICD-10-CM

## 2016-02-26 DIAGNOSIS — F17210 Nicotine dependence, cigarettes, uncomplicated: Secondary | ICD-10-CM | POA: Insufficient documentation

## 2016-02-26 DIAGNOSIS — R197 Diarrhea, unspecified: Secondary | ICD-10-CM | POA: Insufficient documentation

## 2016-02-26 DIAGNOSIS — R05 Cough: Secondary | ICD-10-CM | POA: Insufficient documentation

## 2016-02-26 LAB — CBC
HCT: 42.7 % (ref 40.0–52.0)
Hemoglobin: 14.8 g/dL (ref 13.0–18.0)
MCH: 31.7 pg (ref 26.0–34.0)
MCHC: 34.8 g/dL (ref 32.0–36.0)
MCV: 91.2 fL (ref 80.0–100.0)
Platelets: 236 10*3/uL (ref 150–440)
RBC: 4.68 MIL/uL (ref 4.40–5.90)
RDW: 13.4 % (ref 11.5–14.5)
WBC: 7.5 10*3/uL (ref 3.8–10.6)

## 2016-02-26 LAB — URINALYSIS, COMPLETE (UACMP) WITH MICROSCOPIC
Bacteria, UA: NONE SEEN
Bilirubin Urine: NEGATIVE
Glucose, UA: NEGATIVE mg/dL
Hgb urine dipstick: NEGATIVE
Ketones, ur: NEGATIVE mg/dL
Leukocytes, UA: NEGATIVE
Nitrite: NEGATIVE
Protein, ur: NEGATIVE mg/dL
Specific Gravity, Urine: 1.019 (ref 1.005–1.030)
Squamous Epithelial / LPF: NONE SEEN
pH: 6 (ref 5.0–8.0)

## 2016-02-26 LAB — COMPREHENSIVE METABOLIC PANEL
ALT: 38 U/L (ref 17–63)
AST: 35 U/L (ref 15–41)
Albumin: 4.4 g/dL (ref 3.5–5.0)
Alkaline Phosphatase: 59 U/L (ref 38–126)
Anion gap: 8 (ref 5–15)
BUN: 12 mg/dL (ref 6–20)
CO2: 24 mmol/L (ref 22–32)
Calcium: 9 mg/dL (ref 8.9–10.3)
Chloride: 104 mmol/L (ref 101–111)
Creatinine, Ser: 1.11 mg/dL (ref 0.61–1.24)
GFR calc Af Amer: >60 mL/min (ref >60)
GFR calc non Af Amer: >60 mL/min (ref >60)
Glucose, Bld: 115 mg/dL — ABNORMAL HIGH (ref 65–99)
Potassium: 4.1 mmol/L (ref 3.5–5.1)
Sodium: 136 mmol/L (ref 135–145)
Total Bilirubin: 0.9 mg/dL (ref 0.3–1.2)
Total Protein: 7.5 g/dL (ref 6.5–8.1)

## 2016-02-26 LAB — LIPASE, BLOOD: Lipase: 18 U/L (ref 11–51)

## 2016-02-26 MED ORDER — ACETAMINOPHEN 500 MG PO TABS
1000.0000 mg | ORAL_TABLET | Freq: Once | ORAL | Status: AC
  Administered 2016-02-26: 1000 mg via ORAL
  Filled 2016-02-26: qty 2

## 2016-02-26 MED ORDER — ONDANSETRON HCL 4 MG/2ML IJ SOLN
INTRAMUSCULAR | Status: AC
  Administered 2016-02-26: 4 mg via INTRAVENOUS
  Filled 2016-02-26: qty 2

## 2016-02-26 MED ORDER — KETOROLAC TROMETHAMINE 30 MG/ML IJ SOLN
INTRAMUSCULAR | Status: AC
  Administered 2016-02-26: 30 mg via INTRAVENOUS
  Filled 2016-02-26: qty 1

## 2016-02-26 MED ORDER — HYDROCOD POLST-CPM POLST ER 10-8 MG/5ML PO SUER
5.0000 mL | Freq: Once | ORAL | Status: AC
  Administered 2016-02-26: 5 mL via ORAL
  Filled 2016-02-26 (×2): qty 5

## 2016-02-26 MED ORDER — ONDANSETRON HCL 4 MG/2ML IJ SOLN
4.0000 mg | Freq: Once | INTRAMUSCULAR | Status: AC
  Administered 2016-02-26: 4 mg via INTRAVENOUS

## 2016-02-26 MED ORDER — SODIUM CHLORIDE 0.9 % IV BOLUS (SEPSIS)
1000.0000 mL | Freq: Once | INTRAVENOUS | Status: AC
  Administered 2016-02-26: 1000 mL via INTRAVENOUS

## 2016-02-26 MED ORDER — HYDROCOD POLST-CPM POLST ER 10-8 MG/5ML PO SUER: 5.0000 mL | Freq: Every evening | ORAL | 0 refills | Status: DC | PRN

## 2016-02-26 MED ORDER — KETOROLAC TROMETHAMINE 30 MG/ML IJ SOLN
30.0000 mg | Freq: Once | INTRAMUSCULAR | Status: AC
  Administered 2016-02-26: 30 mg via INTRAVENOUS

## 2016-02-26 NOTE — ED Notes (Signed)
See triage note.the patient c/o cough, fever, diarrhea and gen body aches that started yesterday.  Pt also c/o epigastric pain that he sts feels similar to heartburn he's had in past.  Pt alert and oriented.  Nonproductive cough.  Pt denies using inhaler today.

## 2016-02-26 NOTE — ED Triage Notes (Signed)
Pt reports fever up to 102 yesterday and body aches. C/o epigastric pain, vomiting, and diarrhea.  Denies blood in stool or vomit. No distress at this time.

## 2016-02-26 NOTE — ED Provider Notes (Signed)
[facility] Emergency Department Provider Note  ____________________________________________  Time seen: Approximately 7:47 PM  I have reviewed the triage vital signs and the nursing notes.   HISTORY  Chief Complaint Fever   HPI Ethan George is a 40 y.o. male with no significant past medical history who presents for evaluation of flulike symptoms. Patient reports body aches, headache, fever as high as 102F, dry cough, shortness of breath, multiple episodes of watery diarrhea, and 2 episodes of nonbloody nonbilious emesis, and sore throat. Patient has not received his flu shot this season. Both his daughter and wife have had similar symptoms. Hasn't taken anything for his fever earlier today. He also endorses mild epigastric pain after vomiting a few times. No chest pain, no neck stiffness, no rash. Patient is a smoker.  Past Medical History:  Diagnosis Date  . Gout     Patient Active Problem List   Diagnosis Date Noted  . Gout   . 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 Current tobacco use 10/01/2014    Past Surgical History:  Procedure Laterality Date  . BACK SURGERY  2012,2016   X 2    Prior to Admission medications   Medication Sig Start Date End Date Taking? Authorizing Provider  albuterol (PROVENTIL HFA;VENTOLIN HFA) 108 (90 Base) MCG/ACT inhaler Inhale 1-2 puffs into the lungs every 6 (six) hours as needed for wheezing or shortness of breath. 12/03/15   Jami L Hagler, PA-C  allopurinol (ZYLOPRIM) 100 MG tablet 1 tab daily for 1 week, then increase to 2 tabs daily for 1 week. Return for recheck blood work on uric acid in 2 weeks. 01/31/15   Megan P Johnson, DO  azithromycin (ZITHROMAX Z-PAK) 250 MG tablet Take 2 tablets (500 mg) on  Day 1,  followed by 1 tablet (250 mg) once daily on Days 2 through 5. 12/03/15   Jami L Hagler, PA-C  chlorpheniramine-HYDROcodone (TUSSIONEX PENNKINETIC ER) 10-8 MG/5ML SUER Take 5 mLs by mouth  at bedtime as needed for cough. 02/26/16   Nita Sickle, MD  colchicine 0.6 MG tablet 2 tabs now. Then 1 tab 1 hour later. May repeat after 3 days if not better. 12 hours after better, 1 tab BID 01/25/15   Megan P Johnson, DO  naproxen (NAPROSYN) 500 MG tablet Take 1 tablet (500 mg total) by mouth 2 (two) times daily with a meal. 12/26/14   Lutricia Feil, PA-C  predniSONE (DELTASONE) 10 MG tablet Take a daily regimen of 6,5,4,3,2,1 12/03/15   Jami L Hagler, PA-C  promethazine-dextromethorphan (PROMETHAZINE-DM) 6.25-15 MG/5ML syrup Take 5 mLs by mouth 4 (four) times daily as needed for cough. 12/03/15   Jami L Hagler, PA-C  ranitidine (ZANTAC) 150 MG tablet Take 1 tablet (150 mg total) by mouth 2 (two) times daily. 12/03/15 01/02/16  Jami L Hagler, PA-C    Allergies Ultram [tramadol hcl]  Family History  Problem Relation Age of Onset  . Heart attack Maternal Grandfather     Social History Social History  Substance Use Topics  . Smoking status: Current Every Day Smoker    Packs/day: 1.50    Years: 20.00    Types: Cigarettes  . Smokeless tobacco: Never Used  . Alcohol use No    Review of Systems  Constitutional: + fever, body aches Eyes: Negative for visual changes. ENT: + sore throat. Neck: No neck pain  Cardiovascular: Negative for chest pain. Respiratory: + shortness of breath and cough Gastrointestinal: + epigastric abdominal  pain, vomiting and diarrhea. Genitourinary: Negative for dysuria. Musculoskeletal: Negative for back pain. Skin: Negative for rash. Neurological: Negative for  weakness or numbness. + HA Psych: No SI or HI  ____________________________________________   PHYSICAL EXAM:  VITAL SIGNS: ED Triage Vitals [02/26/16 1826]  Enc Vitals Group     BP (!) 155/85     Pulse Rate 98     Resp 14     Temp 98.9 F (37.2 C)     Temp Source Oral     SpO2 94 %     Weight 210 lb (95.3 kg)     Height 5\' 9"  (1.753 m)     Head Circumference      Peak Flow       Pain Score 6     Pain Loc      Pain Edu?      Excl. in GC?     Constitutional: Alert and oriented. Well appearing, coughing, no apparent distress. HEENT:      Head: Normocephalic and atraumatic.         Eyes: Conjunctivae are normal. Sclera is non-icteric. EOMI. PERRL      Mouth/Throat: Mucous membranes are moist. Oropharynx is clear with no exudates, erythema, or peritonsillar abscess.      Neck: Supple with no signs of meningismus. Cardiovascular: Regular rate and rhythm. No murmurs, gallops, or rubs. 2+ symmetrical distal pulses are present in all extremities. No JVD. Respiratory: Normal respiratory effort. Lungs are clear to auscultation bilaterally. No wheezes, crackles, or rhonchi.  Gastrointestinal: Soft, non tender, and non distended with positive bowel sounds. No rebound or guarding. Genitourinary: No CVA tenderness. Musculoskeletal: Nontender with normal range of motion in all extremities. No edema, cyanosis, or erythema of extremities. Neurologic: Normal speech and language. Face is symmetric. Moving all extremities. No gross focal neurologic deficits are appreciated. Skin: Skin is warm, dry and intact. No rash noted. Psychiatric: Mood and affect are normal. Speech and behavior are normal.  ____________________________________________   LABS (all labs ordered are listed, but only abnormal results are displayed)  Labs Reviewed  COMPREHENSIVE METABOLIC PANEL - Abnormal; Notable for the following:       Result Value   Glucose, Bld 115 (*)    All other components within normal limits  URINALYSIS, COMPLETE (UACMP) WITH MICROSCOPIC - Abnormal; Notable for the following:    Color, Urine YELLOW (*)    APPearance CLEAR (*)    All other components within normal limits  LIPASE, BLOOD  CBC   ____________________________________________  EKG  ED ECG REPORT I, Nita Sicklearolina Mariaha Ellington, the attending physician, personally viewed and interpreted this ECG.  Normal sinus rhythm,  rate of 98, normal intervals, normal axis, no ST elevations or depressions.  ____________________________________________  RADIOLOGY  CXR: negative ____________________________________________   PROCEDURES  Procedure(s) performed: None Procedures Critical Care performed:  None ____________________________________________   INITIAL IMPRESSION / ASSESSMENT AND PLAN / ED COURSE   40 y.o. male with no significant past medical history who presents for evaluation of flulike symptoms since yesterday. Patient's family members all had similar symptoms. Patient is well-appearing, in no distress, has normal vital signs, has normal physical exam with no meningeal signs, lungs clear to auscultation, oropharynx is clear, no rash, soft abdomen. EKG with no evidence of ischemia or ST elevations concerning for myocarditis/pericarditis. We'll give IV fluids, IV Toradol, and IV Zofran for symptom relief. Will get CXR to rule out pna.  Clinical Course as of Feb 26 2116  Wynelle LinkSun Feb 26, 2016  2115 Chest x-ray and labs with no acute findings. Presentation concerning for a viral URI. We'll discharge patient home with supportive care.  [CV]    Clinical Course User Index [CV] Nita Sicklearolina Jacob Chamblee, MD    Pertinent labs & imaging results that were available during my care of the patient were reviewed by me and considered in my medical decision making (see chart for details).    ____________________________________________   FINAL CLINICAL IMPRESSION(S) / ED DIAGNOSES  Final diagnoses:  Flu-like symptoms      NEW MEDICATIONS STARTED DURING THIS VISIT:  New Prescriptions   CHLORPHENIRAMINE-HYDROCODONE (TUSSIONEX PENNKINETIC ER) 10-8 MG/5ML SUER    Take 5 mLs by mouth at bedtime as needed for cough.     Note:  This document was prepared using Dragon voice recognition software and may include unintentional dictation errors.    Nita Sicklearolina Revel Stellmach, MD 02/26/16 2118

## 2016-02-26 NOTE — Discharge Instructions (Signed)

## 2016-07-31 ENCOUNTER — Emergency Department
Admission: EM | Admit: 2016-07-31 | Discharge: 2016-07-31 | Disposition: A | Discharge status: Home / Self Care | Payer: BLUE CROSS/BLUE SHIELD | Attending: Emergency Medicine | Admitting: Emergency Medicine

## 2016-07-31 ENCOUNTER — Emergency Department: Payer: BLUE CROSS/BLUE SHIELD

## 2016-07-31 ENCOUNTER — Encounter: Payer: Self-pay | Admitting: Emergency Medicine

## 2016-07-31 DIAGNOSIS — Y939 Activity, unspecified: Secondary | ICD-10-CM | POA: Insufficient documentation

## 2016-07-31 DIAGNOSIS — F17210 Nicotine dependence, cigarettes, uncomplicated: Secondary | ICD-10-CM | POA: Insufficient documentation

## 2016-07-31 DIAGNOSIS — Y999 Unspecified external cause status: Secondary | ICD-10-CM | POA: Insufficient documentation

## 2016-07-31 DIAGNOSIS — W2203XA Walked into furniture, initial encounter: Secondary | ICD-10-CM | POA: Insufficient documentation

## 2016-07-31 DIAGNOSIS — S9032XA Contusion of left foot, initial encounter: Secondary | ICD-10-CM | POA: Insufficient documentation

## 2016-07-31 DIAGNOSIS — Z79899 Other long term (current) drug therapy: Secondary | ICD-10-CM | POA: Insufficient documentation

## 2016-07-31 DIAGNOSIS — Y929 Unspecified place or not applicable: Secondary | ICD-10-CM | POA: Insufficient documentation

## 2016-07-31 MED ORDER — HYDROCODONE-ACETAMINOPHEN 5-325 MG PO TABS: 1.0000 | ORAL_TABLET | ORAL | 0 refills | Status: DC | PRN

## 2016-07-31 MED ORDER — IBUPROFEN 600 MG PO TABS: 600.0000 mg | ORAL_TABLET | Freq: Three times a day (TID) | ORAL | 0 refills | Status: DC | PRN

## 2016-07-31 MED ORDER — HYDROCODONE-ACETAMINOPHEN 5-325 MG PO TABS
2.0000 | ORAL_TABLET | Freq: Once | ORAL | Status: AC
  Administered 2016-07-31: 2 via ORAL
  Filled 2016-07-31: qty 2

## 2016-07-31 NOTE — ED Provider Notes (Signed)
[facility] Emergency Department Provider Note ____________________________________________  Time seen: 10 AM  I have reviewed the triage vital signs and the nursing notes.  HISTORY  Chief Complaint  Foot Pain   HPI Ethan George is a 41 y.o. male is here with complaint of left foot pain. Patient states that he ran into the bedpost injuring his foot last night. Patient states is extremely painful to bear weight. Has not taken any over-the-counter medication for his pain. He rates his pain as 5/10 presently.  Past Medical History:  Diagnosis Date  . Gout     Patient Active Problem List   Diagnosis Date Noted  . Gout   . 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 Current tobacco use 10/01/2014    Past Surgical History:  Procedure Laterality Date  . BACK SURGERY  2012,2016   X 2    Prior to Admission medications   Medication Sig Start Date End Date Taking? Authorizing Provider  albuterol (PROVENTIL HFA;VENTOLIN HFA) 108 (90 Base) MCG/ACT inhaler Inhale 1-2 puffs into the lungs every 6 (six) hours as needed for wheezing or shortness of breath. 12/03/15   Hagler, Jami L, PA-C  allopurinol (ZYLOPRIM) 100 MG tablet 1 tab daily for 1 week, then increase to 2 tabs daily for 1 week. Return for recheck blood work on uric acid in 2 weeks. 01/31/15   Johnson, Megan P, DO  colchicine 0.6 MG tablet 2 tabs now. Then 1 tab 1 hour later. May repeat after 3 days if not better. 12 hours after better, 1 tab BID 01/25/15   Johnson, Megan P, DO  HYDROcodone-acetaminophen (NORCO/VICODIN) 5-325 MG tablet Take 1 tablet by mouth every 4 (four) hours as needed for moderate pain. 07/31/16   Tommi RumpsSummers, Rhonda L, PA-C  ibuprofen (ADVIL,MOTRIN) 600 MG tablet Take 1 tablet (600 mg total) by mouth every 8 (eight) hours as needed. 07/31/16   Tommi RumpsSummers, Rhonda L, PA-C  promethazine-dextromethorphan (PROMETHAZINE-DM) 6.25-15 MG/5ML syrup Take 5 mLs by mouth 4 (four) times daily  as needed for cough. 12/03/15   Hagler, Jami L, PA-C  ranitidine (ZANTAC) 150 MG tablet Take 1 tablet (150 mg total) by mouth 2 (two) times daily. 12/03/15 01/02/16  Hagler, Jami L, PA-C    Allergies Ultram [tramadol hcl]  Family History  Problem Relation Age of Onset  . Heart attack Maternal Grandfather     Social History Social History  Substance Use Topics  . Smoking status: Current Every Day Smoker    Packs/day: 1.50    Years: 20.00    Types: Cigarettes  . Smokeless tobacco: Never Used  . Alcohol use No    Review of Systems Constitutional: Negative for fever. Cardiovascular: Negative for chest pain. Respiratory: Negative for shortness of breath. Musculoskeletal: Positive for left foot pain. Skin: Negative for rash. Neurological: Negative for  focal weakness or numbness. ____________________________________________  PHYSICAL EXAM:  VITAL SIGNS: ED Triage Vitals  Enc Vitals Group     BP 07/31/16 0801 140/85     Pulse Rate 07/31/16 0801 67     Resp --      Temp 07/31/16 0801 97.6 F (36.4 C)     Temp Source 07/31/16 0801 Oral     SpO2 07/31/16 0801 97 %     Weight 07/31/16 0758 200 lb (90.7 kg)     Height 07/31/16 0758 5\' 9"  (1.753 m)     Head Circumference --      [name] --  Pain Score 07/31/16 0757 5     Pain Loc --      Pain Edu? --      Excl. in GC? --     Constitutional: Alert and oriented. Well appearing and in no distress. Head: Normocephalic and atraumatic. Eyes: Conjunctivae are normal.  Cardiovascular: Normal rate, regular rhythm. Normal distal pulses. Respiratory: Normal respiratory effort. No wheezes/rales/rhonchi. Musculoskeletal: Moves upper extremities without any difficulty. On examination of left foot there is no gross deformity noted. There is minimal soft tissue tenderness on the lateral aspect at the base of the fourth and fifth metatarsals. There is no deformity of the digits. Patient is able to move digits and motor sensory  function intact. Neurologic:   Normal speech and language. No gross focal neurologic deficits are appreciated. Skin:  Skin is warm, dry and intact. No ecchymosis or abrasions are noted. Psychiatric: Mood and affect are normal. Patient exhibits appropriate insight and judgment.   RADIOLOGY Left foot x-ray per radiologist is negative for fracture. I, Tommi Rumps, personally viewed and evaluated these images (plain radiographs) as part of my medical decision making, as well as reviewing the written report by the radiologist. ____________________________________________  INITIAL IMPRESSION / ASSESSMENT AND PLAN / ED COURSE  Patient was placed in an Ace wrap and wooden shoe. Patient was given a prescription for ibuprofen 600 mg 3 times a day with food and Norco one every 4 hours if needed for severe pain. Patient is to ice and elevate today. He was given a note for work. He will follow-up with [facility]  clinic if any continued problems.    ____________________________________________  FINAL CLINICAL IMPRESSION(S) / ED DIAGNOSES  Final diagnoses:  Contusion of left foot, initial encounter     Tommi Rumps, PA-C 07/31/16 1120    Merrily Brittle, MD 07/31/16 1254

## 2016-07-31 NOTE — ED Notes (Signed)
NAD noted at time of D/C. Pt taken to the lobby via wheelchair by SO at bedside. Denies any comments/concerns at this time.

## 2016-07-31 NOTE — Discharge Instructions (Signed)
[facility] if any [facility]. Ice and elevation today. Wear postop shoe and Ace wrap for extra support.

## 2016-07-31 NOTE — ED Triage Notes (Signed)
Pt states he hit his left foot on bed post last pm, area swollen today and unable to bear full weight on it.

## 2016-08-27 ENCOUNTER — Emergency Department
Admission: EM | Admit: 2016-08-27 | Discharge: 2016-08-27 | Disposition: A | Discharge status: Home / Self Care | Payer: BLUE CROSS/BLUE SHIELD | Attending: Emergency Medicine | Admitting: Emergency Medicine

## 2016-08-27 DIAGNOSIS — Z79899 Other long term (current) drug therapy: Secondary | ICD-10-CM | POA: Insufficient documentation

## 2016-08-27 DIAGNOSIS — F17210 Nicotine dependence, cigarettes, uncomplicated: Secondary | ICD-10-CM | POA: Insufficient documentation

## 2016-08-27 DIAGNOSIS — M5442 Lumbago with sciatica, left side: Secondary | ICD-10-CM | POA: Insufficient documentation

## 2016-08-27 MED ORDER — PREDNISONE 10 MG (21) PO TBPK: ORAL_TABLET | Freq: Every day | ORAL | 0 refills | Status: AC

## 2016-08-27 MED ORDER — METHYLPREDNISOLONE SODIUM SUCC 125 MG IJ SOLR
INTRAMUSCULAR | Status: AC
  Administered 2016-08-27: 125 mg via INTRAMUSCULAR
  Filled 2016-08-27: qty 2

## 2016-08-27 MED ORDER — ORPHENADRINE CITRATE 30 MG/ML IJ SOLN
60.0000 mg | Freq: Two times a day (BID) | INTRAMUSCULAR | Status: DC
  Administered 2016-08-27: 60 mg via INTRAMUSCULAR
  Filled 2016-08-27 (×2): qty 2

## 2016-08-27 MED ORDER — METHYLPREDNISOLONE SODIUM SUCC 125 MG IJ SOLR
125.0000 mg | Freq: Once | INTRAMUSCULAR | Status: AC
  Administered 2016-08-27: 125 mg via INTRAMUSCULAR
  Filled 2016-08-27: qty 2

## 2016-08-27 NOTE — ED Notes (Signed)
See triage note  States he picked up his daughter after she fell off trampoline last pm  Having pain to lower back which is moving into left leg  Ambulates with slight limp to treatment room

## 2016-08-27 NOTE — ED Notes (Signed)
AAOx3.  Skin warm and dry.  NAD 

## 2016-08-27 NOTE — ED Triage Notes (Signed)
Lower left sided back pain after picking up daughter yesterday. Hx of back pain. Pt alert and oriented X4, active, cooperative, pt in NAD. RR even and unlabored, color WNL.

## 2016-08-27 NOTE — ED Provider Notes (Signed)
[facility] Emergency Department Provider Note  ____________________________________________  Time seen: Approximately 4:09 PM  I have reviewed the triage vital signs and the nursing notes.   HISTORY  Chief Complaint Back Pain    HPI Ethan George is a 41 y.o. male presenting to the emergency department with 6 out of 10 low back pain with radiation into the left buttocks. Patient states that his daughter fell off of a trampoline today and he ran outside to pick her up. Patient states that he has experienced pain since aforementioned incident. Patient has a history of chronic low back pain with 2 prior surgeries to the lumbar spine. Patient denies personal recent falls or incidences of trauma. Patient denies weakness or changes in sensation of the upper extremities. Patient denies radiculopathy of the upper extremities. He denies saddle anesthesia or bowel or bladder incontinence. He has been ambulating without difficulty. Patient currently works as an Journalist, newspaperauto mechanic. He is accompanied by his wife. No medications have been attempted prior to presenting to the emergency department.   Past Medical History:  Diagnosis Date  . Gout     Patient Active Problem List   Diagnosis Date Noted  . Gout   . 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 Current tobacco use 10/01/2014    Past Surgical History:  Procedure Laterality Date  . BACK SURGERY  2012,2016   X 2    Prior to Admission medications   Medication Sig Start Date End Date Taking? Authorizing Provider  albuterol (PROVENTIL HFA;VENTOLIN HFA) 108 (90 Base) MCG/ACT inhaler Inhale 1-2 puffs into the lungs every 6 (six) hours as needed for wheezing or shortness of breath. 12/03/15   Hagler, Jami L, PA-C  allopurinol (ZYLOPRIM) 100 MG tablet 1 tab daily for 1 week, then increase to 2 tabs daily for 1 week. Return for recheck blood work on uric acid in 2 weeks. 01/31/15   Johnson, Megan P, DO   colchicine 0.6 MG tablet 2 tabs now. Then 1 tab 1 hour later. May repeat after 3 days if not better. 12 hours after better, 1 tab BID 01/25/15   Johnson, Megan P, DO  HYDROcodone-acetaminophen (NORCO/VICODIN) 5-325 MG tablet Take 1 tablet by mouth every 4 (four) hours as needed for moderate pain. 07/31/16   Tommi RumpsSummers, Rhonda L, PA-C  ibuprofen (ADVIL,MOTRIN) 600 MG tablet Take 1 tablet (600 mg total) by mouth every 8 (eight) hours as needed. 07/31/16   Tommi RumpsSummers, Rhonda L, PA-C  predniSONE (STERAPRED UNI-PAK 21 TAB) 10 MG (21) TBPK tablet Take by mouth daily. Take 6 tablets the first day, take 5 tablets the second day, take 4 tablets the third day, take 3 tablets the fourth day, take 2 tablets the fifth day, take 1 tablet the sixth day. 08/27/16 09/02/16  Orvil FeilWoods, Naziah Portee M, PA-C  promethazine-dextromethorphan (PROMETHAZINE-DM) 6.25-15 MG/5ML syrup Take 5 mLs by mouth 4 (four) times daily as needed for cough. 12/03/15   Hagler, Jami L, PA-C  ranitidine (ZANTAC) 150 MG tablet Take 1 tablet (150 mg total) by mouth 2 (two) times daily. 12/03/15 01/02/16  Hagler, Jami L, PA-C    Allergies Ultram [tramadol hcl]  Family History  Problem Relation Age of Onset  . Heart attack Maternal Grandfather     Social History Social History  Substance Use Topics  . Smoking status: Current Every Day Smoker    Packs/day: 1.50    Years: 20.00    Types: Cigarettes  . Smokeless tobacco: Never Used  .  Alcohol use No     Review of Systems  Constitutional: No fever/chills Eyes: No visual changes. No discharge ENT: No upper respiratory complaints. Cardiovascular: no chest pain. Respiratory: no cough. No SOB. Musculoskeletal: Patient has low back pain with radiation into the left buttocks. Skin: Negative for rash, abrasions, lacerations, ecchymosis. Neurological: Negative for headaches, focal weakness or numbness.  ____________________________________________   PHYSICAL EXAM:  VITAL SIGNS: ED Triage Vitals  Enc  Vitals Group     BP 08/27/16 1451 (!) 154/95     Pulse Rate 08/27/16 1451 85     Resp 08/27/16 1451 18     Temp 08/27/16 1451 97.6 F (36.4 C)     Temp Source 08/27/16 1451 Oral     SpO2 08/27/16 1451 100 %     Weight 08/27/16 1451 200 lb (90.7 kg)     Height 08/27/16 1451 5\' 9"  (1.753 m)     Head Circumference --      Peak Flow --      Pain Score 08/27/16 1450 6     Pain Loc --      Pain Edu? --      Excl. in GC? --      Constitutional: Alert and oriented. Well appearing and in no acute distress. Eyes: Conjunctivae are normal. PERRL. EOMI. Head: Atraumatic. Cardiovascular: Normal rate, regular rhythm. Normal S1 and S2.  Good peripheral circulation. Respiratory: Normal respiratory effort without tachypnea or retractions. Lungs CTAB. Good air entry to the bases with no decreased or absent breath sounds. Musculoskeletal: Patient has 5/5 strength in the upper and lower extremities bilaterally. Full range of motion at the shoulder, elbow and wrist bilaterally. Full range of motion at the hip, knee and ankle bilaterally. No changes in gait.   Neurologic:  Normal speech and language. No gross focal neurologic deficits are appreciated.  Skin:  Skin is warm, dry and intact. No rash noted. Psychiatric: Mood and affect are normal. Speech and behavior are normal. Patient exhibits appropriate insight and judgement.   ____________________________________________   LABS (all labs ordered are listed, but only abnormal results are displayed)  Labs Reviewed - No data to display ____________________________________________  EKG   ____________________________________________  RADIOLOGY   No results found.  ____________________________________________    PROCEDURES  Procedure(s) performed:    Procedures    Medications  orphenadrine (NORFLEX) injection 60 mg (not administered)  methylPREDNISolone sodium succinate (SOLU-MEDROL) 125 mg/2 mL injection 125 mg (not administered)      ____________________________________________   INITIAL IMPRESSION / ASSESSMENT AND PLAN / ED COURSE  Pertinent labs & imaging results that were available during my care of the patient were reviewed by me and considered in my medical decision making (see chart for details).  Review of the [place] CSRS was performed in accordance of the [facility] prior to dispensing any controlled drugs.     Assessment and plan: Low Back Pain:  Patient presents to the emergency department with low back pain with radiation into the left buttocks. Patient states that his low back pain worsened in intensity after picking up his daughter yesterday. Patient was given an injection of Norflex in the emergency department and Solu-Medrol. Patient was discharged with tapered prednisone. Vital signs were reassuring prior to discharge aside from hypertension. All patient questions were answered.   ____________________________________________  FINAL CLINICAL IMPRESSION(S) / ED DIAGNOSES  Final diagnoses:  Acute left-sided low back pain with left-sided sciatica      NEW MEDICATIONS STARTED DURING THIS VISIT:  New  Prescriptions   PREDNISONE (STERAPRED UNI-PAK 21 TAB) 10 MG (21) TBPK TABLET    Take by mouth daily. Take 6 tablets the first day, take 5 tablets the second day, take 4 tablets the third day, take 3 tablets the fourth day, take 2 tablets the fifth day, take 1 tablet the sixth day.        This chart was dictated using voice recognition software/Dragon. Despite best efforts to proofread, errors can occur which can change the meaning. Any change was purely unintentional.    Orvil Feil, PA-C 08/27/16 1614    Phineas Semen, MD 08/27/16 [phone]

## 2016-12-20 ENCOUNTER — Emergency Department
Admission: EM | Admit: 2016-12-20 | Discharge: 2016-12-20 | Disposition: A | Discharge status: Home / Self Care | Payer: Managed Care, Other (non HMO) | Attending: Emergency Medicine | Admitting: Emergency Medicine

## 2016-12-20 ENCOUNTER — Encounter: Payer: Self-pay | Admitting: Emergency Medicine

## 2016-12-20 DIAGNOSIS — Y939 Activity, unspecified: Secondary | ICD-10-CM | POA: Diagnosis not present

## 2016-12-20 DIAGNOSIS — W228XXA Striking against or struck by other objects, initial encounter: Secondary | ICD-10-CM | POA: Insufficient documentation

## 2016-12-20 DIAGNOSIS — S71112A Laceration without foreign body, left thigh, initial encounter: Secondary | ICD-10-CM | POA: Diagnosis not present

## 2016-12-20 DIAGNOSIS — Y929 Unspecified place or not applicable: Secondary | ICD-10-CM | POA: Diagnosis not present

## 2016-12-20 DIAGNOSIS — Y999 Unspecified external cause status: Secondary | ICD-10-CM | POA: Diagnosis not present

## 2016-12-20 DIAGNOSIS — F17210 Nicotine dependence, cigarettes, uncomplicated: Secondary | ICD-10-CM | POA: Insufficient documentation

## 2016-12-20 DIAGNOSIS — Z23 Encounter for immunization: Secondary | ICD-10-CM | POA: Insufficient documentation

## 2016-12-20 DIAGNOSIS — S79922A Unspecified injury of left thigh, initial encounter: Secondary | ICD-10-CM | POA: Diagnosis present

## 2016-12-20 DIAGNOSIS — Z79899 Other long term (current) drug therapy: Secondary | ICD-10-CM | POA: Insufficient documentation

## 2016-12-20 MED ORDER — HYDROCODONE-ACETAMINOPHEN 5-325 MG PO TABS
1.0000 | ORAL_TABLET | Freq: Once | ORAL | Status: AC
  Administered 2016-12-20: 1 via ORAL
  Filled 2016-12-20: qty 1

## 2016-12-20 MED ORDER — LIDOCAINE-EPINEPHRINE 2 %-1:100000 IJ SOLN
INTRAMUSCULAR | Status: AC
  Administered 2016-12-20: 1 mL
  Filled 2016-12-20: qty 1.7

## 2016-12-20 MED ORDER — TETANUS-DIPHTH-ACELL PERTUSSIS 5-2.5-18.5 LF-MCG/0.5 IM SUSP
0.5000 mL | Freq: Once | INTRAMUSCULAR | Status: AC
  Administered 2016-12-20: 0.5 mL via INTRAMUSCULAR
  Filled 2016-12-20: qty 0.5

## 2016-12-20 MED ORDER — LIDOCAINE HCL (PF) 1 % IJ SOLN
5.0000 mL | Freq: Once | INTRAMUSCULAR | Status: AC
  Administered 2016-12-20: 5 mL
  Filled 2016-12-20: qty 5

## 2016-12-20 MED ORDER — LIDOCAINE-EPINEPHRINE (PF) 2 %-1:200000 IJ SOLN
10.0000 mL | Freq: Once | INTRAMUSCULAR | Status: DC
  Filled 2016-12-20 (×2): qty 20

## 2016-12-20 MED ORDER — HYDROCODONE-ACETAMINOPHEN 5-325 MG PO TABS: 1.0000 | ORAL_TABLET | Freq: Four times a day (QID) | ORAL | 0 refills | Status: DC | PRN

## 2016-12-20 MED ORDER — CYCLOBENZAPRINE HCL 5 MG PO TABS: 5.0000 mg | ORAL_TABLET | Freq: Three times a day (TID) | ORAL | 0 refills | Status: DC | PRN

## 2016-12-20 NOTE — ED Notes (Signed)
Ace wrap and 4x4's applied to help control bleeding at this time

## 2016-12-20 NOTE — ED Provider Notes (Signed)
[facility] Emergency Department Provider Note ____________________________________________  Time seen: 2054  I have reviewed the triage vital signs and the nursing notes.  HISTORY  Chief Complaint  Laceration  HPI Ethan George is a 41 y.o. male Presents to the ED for evaluation of a laceration to the lateral left thigh. Patient describes cutting the lateral thigh on a ceramic soap dish in the bathtub. He did not sustain a head injury or any other injury at the time of the fall. He sustained a laceration to left thigh that has an actively bleeding arteriole under pressure dressing.He does note tetanus status is current as of the last 5 years.  Past Medical History:  Diagnosis Date  . Gout     Patient Active Problem List   Diagnosis Date Noted  . Gout   . 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 Current tobacco use 10/01/2014    Past Surgical History:  Procedure Laterality Date  . BACK SURGERY  2012,2016   X 2    Prior to Admission medications   Medication Sig Start Date End Date Taking? Authorizing Provider  albuterol (PROVENTIL HFA;VENTOLIN HFA) 108 (90 Base) MCG/ACT inhaler Inhale 1-2 puffs into the lungs every 6 (six) hours as needed for wheezing or shortness of breath. 12/03/15   Hagler, Jami L, PA-C  allopurinol (ZYLOPRIM) 100 MG tablet 1 tab daily for 1 week, then increase to 2 tabs daily for 1 week. Return for recheck blood work on uric acid in 2 weeks. 01/31/15   Johnson, Megan P, DO  colchicine 0.6 MG tablet 2 tabs now. Then 1 tab 1 hour later. May repeat after 3 days if not better. 12 hours after better, 1 tab BID 01/25/15   Johnson, Megan P, DO  cyclobenzaprine (FLEXERIL) 5 MG tablet Take 1 tablet (5 mg total) by mouth 3 (three) times daily as needed for muscle spasms. 12/20/16   Calloway Andrus, Charlesetta Ivory, PA-C  HYDROcodone-acetaminophen (NORCO) 5-325 MG tablet Take 1 tablet by mouth every 6 (six) hours as needed. 12/20/16    Elaya Droege, Charlesetta Ivory, PA-C  ibuprofen (ADVIL,MOTRIN) 600 MG tablet Take 1 tablet (600 mg total) by mouth every 8 (eight) hours as needed. 07/31/16   Tommi Rumps, PA-C  promethazine-dextromethorphan (PROMETHAZINE-DM) 6.25-15 MG/5ML syrup Take 5 mLs by mouth 4 (four) times daily as needed for cough. 12/03/15   Hagler, Jami L, PA-C  ranitidine (ZANTAC) 150 MG tablet Take 1 tablet (150 mg total) by mouth 2 (two) times daily. 12/03/15 01/02/16  Hagler, Jami L, PA-C    Allergies Ultram [tramadol hcl]  Family History  Problem Relation Age of Onset  . Heart attack Maternal Grandfather     Social History Social History  Substance Use Topics  . Smoking status: Current Every Day Smoker    Packs/day: 1.50    Years: 20.00    Types: Cigarettes  . Smokeless tobacco: Never Used  . Alcohol use No    Review of Systems  Constitutional: Negative for fever. Musculoskeletal: Negative for back pain. Skin: Negative for rash. Left thig laceration as above Neurological: Negative for headaches, focal weakness or numbness. ____________________________________________  PHYSICAL EXAM:  VITAL SIGNS: ED Triage Vitals  Enc Vitals Group     BP 12/20/16 2032 (!) 153/84     Pulse Rate 12/20/16 2032 84     Resp 12/20/16 2032 16     Temp 12/20/16 2032 98.5 F (36.9 C)     Temp Source 12/20/16 2032  Oral     SpO2 12/20/16 2032 96 %     Weight 12/20/16 2030 210 lb (95.3 kg)     Height 12/20/16 2030  (1.753 m)     Head Circumference --      Peak Flow --      Pain Score 12/20/16 2030 5     Pain Loc --      Pain Edu? --      Excl. in GC? --     Constitutional: Alert and oriented. Well appearing and in no distress. Head: Normocephalic and atraumatic. Cardiovascular: Normal rate, regular rhythm. Normal distal pulses. Respiratory: Normal respiratory effort.  Musculoskeletal: left lateral thigh with a deep laceration to the distal vastus lateralis. Normal, but painful knee flexion and  extension. Patient with a local hematoma within the wound. An active arteriole is noted. Lido w/ epi used locally to control bleeding in the field. Nontender with normal range of motion in all extremities.  Neurologic:  Normal gait without ataxia. Normal speech and language. No gross focal neurologic deficits are appreciated. Skin:  Skin is warm, dry and intact. No rash noted. ____________________________________________  PROCEDURES  Tdap 0.5 ml IM Norco 5-325 mg PO  LACERATION REPAIR Performed by: Lissa Hoard Authorized by: Lissa Hoard Consent: Verbal consent obtained. Risks and benefits: risks, benefits and alternatives were discussed Consent given by: patient Patient identity confirmed: provided demographic data Prepped and Draped in normal sterile fashion Wound explored  Laceration Location: distal left thigh  Laceration Length: 3 cm  No Foreign Bodies seen or palpated  Anesthesia: local infiltration  Local anesthetic: lidocaine 2% w/ epinephrine 1.7 ml                  Lidocaine w/o epi 5 ml    Irrigation method: syringe + saline Amount of cleaning: standard sterile prep & drape  Skin closure: 4-0 vicryl - 1 running subcutaneous                       3-0 nylon - 5 interrupted dermal sutures  Patient tolerance: Patient tolerated the procedure well with no immediate complications. A pressure dressing is applied using petroleum-soaked gauze/4x4 gauze/gauze roll/ace bandage ____________________________________________  INITIAL IMPRESSION / ASSESSMENT AND PLAN / ED COURSE  Patient with the ED evaluation management of a lateral left thigh laceration secondary to a porcelain soap dish. The wound is cleaned, prepped, and draped as appropriate suture repair is performed. Good wound approximation and hemostasis is achieved. A pressure dressing is applied and the patient is given wound care structures. He is also discharged with prescriptions for  hydrocodone and Flexeril given the nature of the laceration and deep muscle injury. He will follow-up with [place] urgent [facility] for intermittent wound check and suture removal in 10-14 days. A work note is provided for tomorrow as requested.  ____________________________________________  FINAL CLINICAL IMPRESSION(S) / ED DIAGNOSES  Final diagnoses:  Laceration of left thigh, initial encounter      Lissa Hoard, PA-C 12/20/16 2339    Sharyn Creamer, MD 12/22/16 [phone]

## 2016-12-20 NOTE — Discharge Instructions (Signed)
You have sustained a small, but deep leg laceration. It will cause some muscle pain, spasm, and stiffness. Keep the wound clean, dry, and covered. Take the pain medicines as needed. Follow-up with [facility] for suture removal in 10-14 days. Apply ice to reduce pain and swelling.

## 2016-12-20 NOTE — ED Triage Notes (Signed)
Pt slipped and cut leg on ceramic tile of tub. Did not hit head. Laceration to left thigh. Pt holding gauze to laceration.

## 2016-12-26 ENCOUNTER — Encounter: Payer: Self-pay | Admitting: Emergency Medicine

## 2016-12-26 ENCOUNTER — Emergency Department: Payer: Managed Care, Other (non HMO)

## 2016-12-26 ENCOUNTER — Emergency Department
Admission: EM | Admit: 2016-12-26 | Discharge: 2016-12-26 | Disposition: A | Discharge status: Home / Self Care | Payer: Managed Care, Other (non HMO) | Attending: Student in an Organized Health Care Education/Training Program | Admitting: Student in an Organized Health Care Education/Training Program

## 2016-12-26 DIAGNOSIS — X58XXXD Exposure to other specified factors, subsequent encounter: Secondary | ICD-10-CM | POA: Diagnosis not present

## 2016-12-26 DIAGNOSIS — L089 Local infection of the skin and subcutaneous tissue, unspecified: Secondary | ICD-10-CM | POA: Insufficient documentation

## 2016-12-26 DIAGNOSIS — F17210 Nicotine dependence, cigarettes, uncomplicated: Secondary | ICD-10-CM | POA: Insufficient documentation

## 2016-12-26 DIAGNOSIS — M79652 Pain in left thigh: Secondary | ICD-10-CM | POA: Diagnosis present

## 2016-12-26 DIAGNOSIS — S71112D Laceration without foreign body, left thigh, subsequent encounter: Secondary | ICD-10-CM | POA: Diagnosis not present

## 2016-12-26 DIAGNOSIS — T148XXA Other injury of unspecified body region, initial encounter: Secondary | ICD-10-CM

## 2016-12-26 MED ORDER — CEPHALEXIN 500 MG PO CAPS: 500.0000 mg | ORAL_CAPSULE | Freq: Four times a day (QID) | ORAL | 0 refills | Status: DC

## 2016-12-26 MED ORDER — OXYCODONE-ACETAMINOPHEN 5-325 MG PO TABS
1.0000 | ORAL_TABLET | Freq: Once | ORAL | Status: AC
  Administered 2016-12-26: 1 via ORAL
  Filled 2016-12-26: qty 1

## 2016-12-26 MED ORDER — HYDROCODONE-ACETAMINOPHEN 2.5-325 MG PO TABS
ORAL_TABLET | ORAL | 0 refills | Status: DC
Start: 2016-12-26 — End: 2016-12-31

## 2016-12-26 MED ORDER — CEPHALEXIN 500 MG PO CAPS
500.0000 mg | ORAL_CAPSULE | Freq: Once | ORAL | Status: AC
  Administered 2016-12-26: 500 mg via ORAL
  Filled 2016-12-26: qty 1

## 2016-12-26 NOTE — ED Triage Notes (Signed)
Pt to ED today for wound recheck. Pt reports he had sutures placed in this ED on 10/11 on the left leg and since has had bloody/yellow drainage around sutures. On assessment, wound is clean and dry with redness at suture site, light brown discharge on bandage.

## 2016-12-26 NOTE — ED Notes (Signed)
Pt. States pain to lt. Knee.  Pt. States sutures were put in 6 days ago.  Slight redness to two sutures.  Pt. States increased pain due to fall on lt. Knee.  Pt. States no x-rays to lt. Knee.  Pt. Also states some yellow drainage to sutures.

## 2016-12-26 NOTE — ED Provider Notes (Signed)
[facility] Emergency Department Provider Note  ____________________________________________  Time seen: Approximately 11:03 PM  I have reviewed the triage vital signs and the nursing notes.   HISTORY  Chief Complaint Wound Check    HPI Ethan George is a 41 y.o. male that presents to the emergency department for evaluation of drainage and pain around sutured laceration site. He had stitches placed  one week ago. 2 days ago he noticed yellow drainage coming from the outside of the wound. He states that he is also having pain on the side of his left knee but focus was on the laceration when he was seen in ED. He has been limping due to pain. He has been climbing up and down ladders for work since the injury.  Injury occurred after falling in the bathtub on a ceramic soap dish.  No fever, chills, numbness, tingling.   Past Medical History:  Diagnosis Date  . Gout     Patient Active Problem List   Diagnosis Date Noted  . Gout   . 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 Current tobacco use 10/01/2014    Past Surgical History:  Procedure Laterality Date  . BACK SURGERY  2012,2016   X 2    Prior to Admission medications   Medication Sig Start Date End Date Taking? Authorizing Provider  albuterol (PROVENTIL HFA;VENTOLIN HFA) 108 (90 Base) MCG/ACT inhaler Inhale 1-2 puffs into the lungs every 6 (six) hours as needed for wheezing or shortness of breath. 12/03/15   Hagler, Jami L, PA-C  allopurinol (ZYLOPRIM) 100 MG tablet 1 tab daily for 1 week, then increase to 2 tabs daily for 1 week. Return for recheck blood work on uric acid in 2 weeks. 01/31/15   Johnson, Megan P, DO  cephALEXin (KEFLEX) 500 MG capsule Take 1 capsule (500 mg total) by mouth 4 (four) times daily. 12/26/16 01/05/17  Enid Derry, PA-C  colchicine 0.6 MG tablet 2 tabs now. Then 1 tab 1 hour later. May repeat after 3 days if not better. 12 hours after better, 1 tab BID  01/25/15   Johnson, Megan P, DO  cyclobenzaprine (FLEXERIL) 5 MG tablet Take 1 tablet (5 mg total) by mouth 3 (three) times daily as needed for muscle spasms. 12/20/16   Menshew, Charlesetta Ivory, PA-C  Hydrocodone-Acetaminophen 2.5-325 MG TABS Take 2 pills as needed for pain 12/26/16   Enid Derry, PA-C  ibuprofen (ADVIL,MOTRIN) 600 MG tablet Take 1 tablet (600 mg total) by mouth every 8 (eight) hours as needed. 07/31/16   Tommi Rumps, PA-C  promethazine-dextromethorphan (PROMETHAZINE-DM) 6.25-15 MG/5ML syrup Take 5 mLs by mouth 4 (four) times daily as needed for cough. 12/03/15   Hagler, Jami L, PA-C  ranitidine (ZANTAC) 150 MG tablet Take 1 tablet (150 mg total) by mouth 2 (two) times daily. 12/03/15 01/02/16  Hagler, Jami L, PA-C    Allergies Ultram [tramadol hcl]  Family History  Problem Relation Age of Onset  . Heart attack Maternal Grandfather     Social History Social History  Substance Use Topics  . Smoking status: Current Every Day Smoker    Packs/day: 1.50    Years: 20.00    Types: Cigarettes  . Smokeless tobacco: Never Used  . Alcohol use No     Review of Systems  Constitutional: No fever/chills Cardiovascular: No chest pain. Respiratory: No SOB. Gastrointestinal: No abdominal pain.  No nausea, no vomiting.  Skin: Negative for rash, ecchymosis. Positive for laceration. Neurological:  Negative for headaches, numbness or tingling   ____________________________________________   PHYSICAL EXAM:  VITAL SIGNS: ED Triage Vitals  Enc Vitals Group     BP 12/26/16 2049 126/81     Pulse Rate 12/26/16 2049 83     Resp 12/26/16 2049 17     Temp 12/26/16 2049 98 F (36.7 C)     Temp Source 12/26/16 2049 Oral     SpO2 12/26/16 2049 100 %     Weight 12/26/16 2050 210 lb (95.3 kg)     Height --      Head Circumference --      Peak Flow --      Pain Score --      Pain Loc --      Pain Edu? --      Excl. in GC? --      Constitutional: Alert and oriented.  Well appearing and in no acute distress. Eyes: Conjunctivae are normal. PERRL. EOMI. Head: Atraumatic. ENT:      Ears:      Nose: No congestion/rhinnorhea.      Mouth/Throat: Mucous membranes are moist.  Neck: No stridor.   Cardiovascular: Normal rate, regular rhythm.  Good peripheral circulation. Respiratory: Normal respiratory effort without tachypnea or retractions. Lungs CTAB. Good air entry to the bases with no decreased or absent breath sounds. Musculoskeletal: Full range of motion to all extremities. No gross deformities appreciated. Neurologic:  Normal speech and language. No gross focal neurologic deficits are appreciated.  Skin:  Skin is warm, dry. 3 cm sutured laceration to distal left thigh. Mild surrounding erythema. No visible drainage. No tenderness to palpation over knee. Full range of motion of knee. No erythema or warmth to the knee.   ____________________________________________   LABS (all labs ordered are listed, but only abnormal results are displayed)  Labs Reviewed - No data to display ____________________________________________  EKG   ____________________________________________  RADIOLOGY Lexine BatonI, Jomarion Mish, personally viewed and evaluated these images (plain radiographs) as part of my medical decision making, as well as reviewing the written report by the radiologist.  Dg Knee Complete 4 Views Left  Result Date: 12/26/2016 CLINICAL DATA:  Drainage from left knee sutures EXAM: LEFT KNEE - COMPLETE 4+ VIEW COMPARISON:  None. FINDINGS: No evidence of fracture, dislocation, or joint effusion. No evidence of arthropathy or other focal bone abnormality. Soft tissues are unremarkable. IMPRESSION: No osseous abnormality or soft tissue emphysema. Electronically Signed   By: Deatra RobinsonKevin  Herman M.D.   On: 12/26/2016 22:56    ____________________________________________    PROCEDURES  Procedure(s) performed:    Procedures    Medications   oxyCODONE-acetaminophen (PERCOCET/ROXICET) 5-325 MG per tablet 1 tablet (1 tablet Oral Given 12/26/16 2253)  cephALEXin (KEFLEX) capsule 500 mg (500 mg Oral Given 12/26/16 2254)     ____________________________________________   INITIAL IMPRESSION / ASSESSMENT AND PLAN / ED COURSE  Pertinent labs & imaging results that were available during my care of the patient were reviewed by me and considered in my medical decision making (see chart for details).  Review of the [place] CSRS was performed in accordance of the [facility] prior to dispensing any controlled drugs.     Patient presented to the emergency department for evaluation of sutured laceration. Site appears to be infected. Knee x-ray negative for acute bony abnormalities. Knee has full range of motion, non tender to palpation, and is not warm or erythematous so low suspicion for septic joint. Patient will be discharged home with prescriptions for Keflex.  Patient is to follow up with PCP as directed. Patient is given ED precautions to return to the ED for any worsening or new symptoms.     ____________________________________________  FINAL CLINICAL IMPRESSION(S) / ED DIAGNOSES  Final diagnoses:  Wound infection      NEW MEDICATIONS STARTED DURING THIS VISIT:  Discharge Medication List as of 12/26/2016 11:17 PM    START taking these medications   Details  cephALEXin (KEFLEX) 500 MG capsule Take 1 capsule (500 mg total) by mouth 4 (four) times daily., Starting Wed 12/26/2016, Until Sat 01/05/2017, Print    Hydrocodone-Acetaminophen 2.5-325 MG TABS Take 2 pills as needed for pain, Print            This chart was dictated using voice recognition software/Dragon. Despite best efforts to proofread, errors can occur which can change the meaning. Any change was purely unintentional.    Enid Derry, PA-C 12/27/16 1058    Willy Eddy, MD 12/28/16 [phone]

## 2016-12-31 ENCOUNTER — Observation Stay
Admission: EM | Admit: 2016-12-31 | Discharge: 2017-01-03 | Disposition: A | Discharge status: Home / Self Care | Payer: Managed Care, Other (non HMO) | Attending: Internal Medicine | Admitting: Internal Medicine

## 2016-12-31 ENCOUNTER — Emergency Department: Payer: Managed Care, Other (non HMO)

## 2016-12-31 ENCOUNTER — Encounter: Payer: Self-pay | Admitting: Emergency Medicine

## 2016-12-31 DIAGNOSIS — Z79899 Other long term (current) drug therapy: Secondary | ICD-10-CM | POA: Diagnosis not present

## 2016-12-31 DIAGNOSIS — K219 Gastro-esophageal reflux disease without esophagitis: Secondary | ICD-10-CM | POA: Diagnosis not present

## 2016-12-31 DIAGNOSIS — M109 Gout, unspecified: Secondary | ICD-10-CM | POA: Diagnosis not present

## 2016-12-31 DIAGNOSIS — L03116 Cellulitis of left lower limb: Secondary | ICD-10-CM | POA: Diagnosis not present

## 2016-12-31 DIAGNOSIS — Y838 Other surgical procedures as the cause of abnormal reaction of the patient, or of later complication, without mention of misadventure at the time of the procedure: Secondary | ICD-10-CM | POA: Diagnosis not present

## 2016-12-31 DIAGNOSIS — F17210 Nicotine dependence, cigarettes, uncomplicated: Secondary | ICD-10-CM | POA: Diagnosis not present

## 2016-12-31 DIAGNOSIS — Z5189 Encounter for other specified aftercare: Secondary | ICD-10-CM

## 2016-12-31 DIAGNOSIS — M79662 Pain in left lower leg: Secondary | ICD-10-CM | POA: Diagnosis present

## 2016-12-31 DIAGNOSIS — L0291 Cutaneous abscess, unspecified: Secondary | ICD-10-CM

## 2016-12-31 DIAGNOSIS — L02416 Cutaneous abscess of left lower limb: Secondary | ICD-10-CM | POA: Diagnosis present

## 2016-12-31 DIAGNOSIS — L7632 Postprocedural hematoma of skin and subcutaneous tissue following other procedure: Secondary | ICD-10-CM | POA: Insufficient documentation

## 2016-12-31 HISTORY — DX: Gastro-esophageal reflux disease without esophagitis: K21.9

## 2016-12-31 LAB — CBC WITH DIFFERENTIAL/PLATELET
Basophils Absolute: 0.1 10*3/uL (ref 0–0.1)
Basophils Relative: 1 %
Eosinophils Absolute: 0.4 10*3/uL (ref 0–0.7)
Eosinophils Relative: 5 %
HCT: 44.4 % (ref 40.0–52.0)
Hemoglobin: 15.2 g/dL (ref 13.0–18.0)
Lymphocytes Relative: 24 %
Lymphs Abs: 2 10*3/uL (ref 1.0–3.6)
MCH: 32 pg (ref 26.0–34.0)
MCHC: 34.3 g/dL (ref 32.0–36.0)
MCV: 93.3 fL (ref 80.0–100.0)
Monocytes Absolute: 0.6 10*3/uL (ref 0.2–1.0)
Monocytes Relative: 7 %
Neutro Abs: 5.4 10*3/uL (ref 1.4–6.5)
Neutrophils Relative %: 63 %
Platelets: 285 10*3/uL (ref 150–440)
RBC: 4.76 MIL/uL (ref 4.40–5.90)
RDW: 13.2 % (ref 11.5–14.5)
WBC: 8.5 10*3/uL (ref 3.8–10.6)

## 2016-12-31 LAB — COMPREHENSIVE METABOLIC PANEL
ALT: 60 U/L (ref 17–63)
AST: 40 U/L (ref 15–41)
Albumin: 4.9 g/dL (ref 3.5–5.0)
Alkaline Phosphatase: 69 U/L (ref 38–126)
Anion gap: 14 (ref 5–15)
BUN: 14 mg/dL (ref 6–20)
CO2: 22 mmol/L (ref 22–32)
Calcium: 9.7 mg/dL (ref 8.9–10.3)
Chloride: 100 mmol/L — ABNORMAL LOW (ref 101–111)
Creatinine, Ser: 0.99 mg/dL (ref 0.61–1.24)
GFR calc Af Amer: >60 mL/min (ref >60)
GFR calc non Af Amer: >60 mL/min (ref >60)
Glucose, Bld: 145 mg/dL — ABNORMAL HIGH (ref 65–99)
Potassium: 3.4 mmol/L — ABNORMAL LOW (ref 3.5–5.1)
Sodium: 136 mmol/L (ref 135–145)
Total Bilirubin: 0.6 mg/dL (ref 0.3–1.2)
Total Protein: 8.2 g/dL — ABNORMAL HIGH (ref 6.5–8.1)

## 2016-12-31 LAB — URIC ACID: Uric Acid, Serum: 10.5 mg/dL — ABNORMAL HIGH (ref 4.4–7.6)

## 2016-12-31 LAB — LACTIC ACID, PLASMA: Lactic Acid, Venous: 1.9 mmol/L (ref 0.5–1.9)

## 2016-12-31 MED ORDER — CLINDAMYCIN PHOSPHATE 600 MG/50ML IV SOLN
600.0000 mg | Freq: Once | INTRAVENOUS | Status: AC
  Administered 2016-12-31: 600 mg via INTRAVENOUS
  Filled 2016-12-31: qty 50

## 2016-12-31 MED ORDER — MORPHINE SULFATE (PF) 4 MG/ML IV SOLN
4.0000 mg | Freq: Once | INTRAVENOUS | Status: AC
  Administered 2016-12-31: 4 mg via INTRAVENOUS
  Filled 2016-12-31: qty 1

## 2016-12-31 MED ORDER — IOPAMIDOL (ISOVUE-300) INJECTION 61%
100.0000 mL | Freq: Once | INTRAVENOUS | Status: AC | PRN
  Administered 2016-12-31: 100 mL via INTRAVENOUS
  Filled 2016-12-31: qty 100

## 2016-12-31 MED ORDER — INDOMETHACIN 50 MG PO CAPS
50.0000 mg | ORAL_CAPSULE | Freq: Once | ORAL | Status: DC
  Filled 2016-12-31: qty 1

## 2016-12-31 MED ORDER — HYDROCODONE-ACETAMINOPHEN 5-325 MG PO TABS: 1.0000 | ORAL_TABLET | ORAL | 0 refills | Status: DC | PRN

## 2016-12-31 MED ORDER — INDOMETHACIN 50 MG PO CAPS: 50.0000 mg | ORAL_CAPSULE | Freq: Three times a day (TID) | ORAL | 1 refills | Status: DC

## 2016-12-31 MED ORDER — CLINDAMYCIN HCL 300 MG PO CAPS: 300.0000 mg | ORAL_CAPSULE | Freq: Four times a day (QID) | ORAL | 0 refills | Status: DC

## 2016-12-31 MED ORDER — ONDANSETRON HCL 4 MG/2ML IJ SOLN
4.0000 mg | Freq: Once | INTRAMUSCULAR | Status: AC
  Administered 2016-12-31: 4 mg via INTRAVENOUS
  Filled 2016-12-31: qty 2

## 2016-12-31 MED ORDER — SODIUM CHLORIDE 0.9 % IV BOLUS (SEPSIS)
1000.0000 mL | Freq: Once | INTRAVENOUS | Status: AC
  Administered 2016-12-31: 1000 mL via INTRAVENOUS

## 2016-12-31 NOTE — ED Provider Notes (Signed)
[facility] Emergency Department Provider Note  ____________________________________________  Time seen: Approximately 8:15 PM  I have reviewed the triage vital signs and the nursing notes.   HISTORY  Chief Complaint Wound Check    HPI Ethan George is a 41 y.o. male who presents emergency department for complaint of increasing pain, erythema, drainage from a wound that was repaired in this department 12 days prior. Patient presented to the emergency department with a laceration to the left lower extremity. See the note for further details including repair. Patient reports that as time went on, area became erythematous, started draining. He was evaluated 4 days prior with no signs of osteomyelitis but was placed on Keflex. Patient reports that wound is looking worse than you have 4 days ago. He is having pain behind the knee. Patient reports erythema, edema. No new injury. He has been taking the antibiotics as prescribed. No other complaints at this time.   Past Medical History:  Diagnosis Date  . GERD (gastroesophageal reflux disease)   . Gout     Patient Active Problem List   Diagnosis Date Noted  . Cellulitis and abscess of left leg 12/31/2016  . GERD (gastroesophageal reflux disease) 12/31/2016  . Gout   . 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 Current tobacco use 10/01/2014    Past Surgical History:  Procedure Laterality Date  . BACK SURGERY  2012,2016   X 2    Prior to Admission medications   Medication Sig Start Date End Date Taking? Authorizing Provider  cephALEXin (KEFLEX) 500 MG capsule Take 1 capsule (500 mg total) by mouth 4 (four) times daily. 12/26/16 01/05/17 Yes Enid DerryWagner, Ashley, PA-C  cyclobenzaprine (FLEXERIL) 5 MG tablet Take 1 tablet (5 mg total) by mouth 3 (three) times daily as needed for muscle spasms. 12/20/16  Yes Menshew, Charlesetta IvoryJenise V Bacon, PA-C  ibuprofen (ADVIL,MOTRIN) 600 MG tablet Take 1 tablet (600 mg  total) by mouth every 8 (eight) hours as needed. 07/31/16  Yes Tommi RumpsSummers, Rhonda L, PA-C  omeprazole (PRILOSEC) 20 MG capsule Take 20 mg by mouth daily.   Yes [provider]  ranitidine (ZANTAC) 150 MG tablet Take 1 tablet (150 mg total) by mouth 2 (two) times daily. 12/03/15 01/02/16  Hagler, Jami L, PA-C    Allergies Ultram [tramadol hcl]  Family History  Problem Relation Age of Onset  . Heart attack Maternal Grandfather     Social History Social History  Substance Use Topics  . Smoking status: Current Every Day Smoker    Packs/day: 1.50    Years: 20.00    Types: Cigarettes  . Smokeless tobacco: Never Used  . Alcohol use No     Review of Systems  Constitutional: No fever/chills Eyes: No visual changes. No discharge ENT: No upper respiratory complaints. Cardiovascular: no chest pain. Respiratory: no cough. No SOB. Gastrointestinal: No abdominal pain.  No nausea, no vomiting.   Musculoskeletal: Positive for erythema, edema, pain to the left knee. Positive for laceration just superior to knee, previously repaired but showing signs of infection. Skin: Negative for rash, abrasions, lacerations, ecchymosis. Neurological: Negative for headaches, focal weakness or numbness. 10-point ROS otherwise negative.  ____________________________________________   PHYSICAL EXAM:  VITAL SIGNS: ED Triage Vitals  Enc Vitals Group     BP 12/31/16 1947 (!) 161/89     Pulse Rate 12/31/16 1947 92     Resp 12/31/16 1947 16     Temp 12/31/16 1947 98.2 F (36.8 C)  Temp Source 12/31/16 1947 Oral     SpO2 12/31/16 1947 99 %     Weight 12/31/16 1945 210 lb (95.3 kg)     Height --      Head Circumference --      Peak Flow --      Pain Score 12/31/16 2008 8     Pain Loc --      Pain Edu? --      Excl. in GC? --      Constitutional: Alert and oriented. Well appearing and in no acute distress. Eyes: Conjunctivae are normal. PERRL. EOMI. Head: Atraumatic. Neck: No stridor.     Cardiovascular: Normal rate, regular rhythm. Normal S1 and S2.  Good peripheral circulation. Respiratory: Normal respiratory effort without tachypnea or retractions. Lungs CTAB. Good air entry to the bases with no decreased or absent breath sounds. Musculoskeletal: Full range of motion to all extremities. No gross deformities appreciated. Mild erythema and edema noted to the left knee upon inspection. The patient does have good range of motion but reports pain with movement. Patient was sutured laceration just superior to the knee. No dehiscence but signs of erythema, edema, pustular drainage noted around sutures. Area is very tender to palpation from laceration to the knee joint. Mild ballottement. Stress testing of the knee is unremarkable. No palpable fluctuance. No induration. Dorsalis pedis pulse intact distally. Sensation intact distally. Neurologic:  Normal speech and language. No gross focal neurologic deficits are appreciated.  Skin:  Skin is warm, dry and intact. No rash noted. Psychiatric: Mood and affect are normal. Speech and behavior are normal. Patient exhibits appropriate insight and judgement.   ____________________________________________   LABS (all labs ordered are listed, but only abnormal results are displayed)  Labs Reviewed  COMPREHENSIVE METABOLIC PANEL - Abnormal; Notable for the following:       Result Value   Potassium 3.4 (*)    Chloride 100 (*)    Glucose, Bld 145 (*)    Total Protein 8.2 (*)    All other components within normal limits  URIC ACID - Abnormal; Notable for the following:    Uric Acid, Serum 10.5 (*)    All other components within normal limits  CBC WITH DIFFERENTIAL/PLATELET  LACTIC ACID, PLASMA  LACTIC ACID, PLASMA   ____________________________________________  EKG   ____________________________________________  RADIOLOGY Festus Barren Cuthriell, personally viewed and evaluated these images (plain radiographs) as part of my medical  decision making, as well as reviewing the written report by the radiologist.  Ct Knee Left W Contrast  Result Date: 12/31/2016 CLINICAL DATA:  41 year old male with left knee pain and drainage. EXAM: CT OF THE left KNEE WITH CONTRAST TECHNIQUE: Multidetector CT imaging was performed following the standard protocol during bolus administration of intravenous contrast. COMPARISON:  Left knee radiograph dated 12/31/2016 and ultrasound dated 12/31/2016 FINDINGS: Bones/Joint/Cartilage There is no acute fracture or dislocation. The bones are well mineralized. An area of cortical irregularity in the distal femoral metaphysis noted which may be related to prior surgery or trauma. Ligaments Suboptimally assessed by CT. Muscles and Tendons No traumatic injury. Soft tissues There is diffuse edema and stranding of the subcutaneous soft tissues of the left knee. There is an ill-defined heterogeneous area in the soft tissues of the anterolateral distal femur proximally 7.5 cm above the knee which appears to extend to the skin. There is thickening of the overlying skin. This corresponds to the complex fluid collection seen on the earlier ultrasound and may represent an  infected hematoma or a complex inflammatory collection with debris. Clinical correlation is recommended. IMPRESSION: Heterogeneous low attenuating area involving the soft tissues of the anterolateral aspect of the distal femur corresponding to the fluid collection seen on the earlier ultrasound. This likely represents a complex fluid with inflammatory debris. Clinical correlation is recommended. Electronically Signed   By: Elgie Collard M.D.   On: 12/31/2016 23:42   [place] Extrem Low Left Ltd  Result Date: 12/31/2016 CLINICAL DATA:  Worsening wounds to left anterior thigh with edema and pain EXAM: ULTRASOUND left LOWER EXTREMITY LIMITED TECHNIQUE: Ultrasound examination of the lower extremity soft tissues was performed in the area of clinical concern.  COMPARISON:  Radiograph 12/31/2016 FINDINGS: Diffuse soft tissue edema within the left lateral knee. Complex fluid collection within the left lateral knee soft tissues measuring 4.4 x 1.3 x 2.2 cm. This appears contiguous with a linear tract extending toward the skin surface. IMPRESSION: 4.4 cm complex fluid collection within the soft tissues of the left lateral knee, with probable sinus tract extending toward the skin surface at its inferior extent. Findings could be secondary to soft tissue abscess, or possibly infected hematoma. Electronically Signed   By: Jasmine Pang M.D.   On: 12/31/2016 21:50   Dg Knee Complete 4 Views Left  Result Date: 12/31/2016 CLINICAL DATA:  Brown drainage from wound at the left knee EXAM: LEFT KNEE - COMPLETE 4+ VIEW COMPARISON:  12/26/2016 FINDINGS: No fracture or malalignment. Joint space compartments are maintained. Soft tissue edema. No soft tissue gas. IMPRESSION: No acute osseous abnormality Electronically Signed   By: Jasmine Pang M.D.   On: 12/31/2016 21:14    ____________________________________________    PROCEDURES  Procedure(s) performed:    Procedures    Medications  sodium chloride 0.9 % bolus 1,000 mL (0 mLs Intravenous Stopped 12/31/16 2236)  clindamycin (CLEOCIN) IVPB 600 mg (0 mg Intravenous Stopped 12/31/16 2324)  morphine 4 MG/ML injection 4 mg (4 mg Intravenous Given 12/31/16 2238)  ondansetron (ZOFRAN) injection 4 mg (4 mg Intravenous Given 12/31/16 2238)  iopamidol (ISOVUE-300) 61 % injection 100 mL (100 mLs Intravenous Contrast Given 12/31/16 2252)     ____________________________________________   INITIAL IMPRESSION / ASSESSMENT AND PLAN / ED COURSE  Pertinent labs & imaging results that were available during my care of the patient were reviewed by me and considered in my medical decision making (see chart for details).  Review of the [place] CSRS was performed in accordance of the [facility] prior to dispensing any controlled  drugs.     Patient's diagnosis is consistent with his for wound check revealing cellulitis and abscess of the left knee. Patient presented to emergency department with a complaint of worsening pain, erythema, edema to the left knee. Patient was seen in this department 12 days prior with repair of a deep laceration to the left anterolateral aspect of the leg. This occurred just superior to the knee. Patient reports that initially there appeared to be healing well, then started showing signs of infection with increasing pain, erythema, drainage. Patient was evaluated 4 days ago, diagnosed with an infection surrounding the site, and discharged home with Keflex. Patient has been taking his antibiotic as prescribed but states that there is a worsening in pain, erythema, edema. Patient reports the pain can radiate up to his groin. He is able to bear weight on the extremity. Patient does have a history of gout but no other medical history. Labs returned with reassuring results at this time with the exception  of significantly elevated uric acid. Whether this is posttraumatic gout versus incidental finding is undetermined at this time. Ultrasound reveals complex fluid collection within the soft tissue of the left lateral knee measuring 4.4 cm. This is consistent with abscess versus infected hematoma. At this time, discussed case with on call orthopedic surgeon, Dr. Martha Clan. He advises admission overnight with IV antibiotics and CT scan of the knee. Hospitalist, Dr. Anne Hahn, is consulted for admission. Patient was given IV Clindamycin in the emergency department, and CT results are still pending.. Patient care will be turned over to hospitalist service for further management..    ____________________________________________  FINAL CLINICAL IMPRESSION(S) / ED DIAGNOSES  Final diagnoses:  Visit for wound check  Cellulitis of left lower extremity  Abscess of left knee      NEW MEDICATIONS STARTED DURING  THIS VISIT:  Current Discharge Medication List          This chart was dictated using voice recognition software/Dragon. Despite best efforts to proofread, errors can occur which can change the meaning. Any change was purely unintentional.    Racheal Patches, PA-C 12/31/16 2352    Dionne Bucy, MD 01/04/17 2115

## 2016-12-31 NOTE — H&P (Signed)
[facility]   PATIENT NAME: Ethan George    MR#:  [id number]  DATE OF BIRTH:  01-08-1976  DATE OF ADMISSION:  12/31/2016  PRIMARY CARE PHYSICIAN: Patient, No Pcp Per   REQUESTING/REFERRING PHYSICIAN: Siadecki, MD  CHIEF COMPLAINT:   Chief Complaint  Patient presents with  . Wound Check    HISTORY OF PRESENT ILLNESS:  Ethan George  is a 41 y.o. male who presents with pain, swelling around wound.  Patient injured his left lateral knee recently and had sift tissue suturing done.  He states that for that past several days the area has become more tender and red.  In the ED imaging suggests possible fluid collection.  Hospitalists called for admission  PAST MEDICAL HISTORY:   Past Medical History:  Diagnosis Date  . GERD (gastroesophageal reflux disease)   . Gout     PAST SURGICAL HISTORY:   Past Surgical History:  Procedure Laterality Date  . BACK SURGERY  2012,2016   X 2    SOCIAL HISTORY:   Social History  Substance Use Topics  . Smoking status: Current Every Day Smoker    Packs/day: 1.50    Years: 20.00    Types: Cigarettes  . Smokeless tobacco: Never Used  . Alcohol use No    FAMILY HISTORY:   Family History  Problem Relation Age of Onset  . Heart attack Maternal Grandfather     DRUG ALLERGIES:   Allergies  Allergen Reactions  . Ultram [Tramadol Hcl] Hives    MEDICATIONS AT HOME:   Prior to Admission medications   Medication Sig Start Date End Date Taking? Authorizing Provider  albuterol (PROVENTIL HFA;VENTOLIN HFA) 108 (90 Base) MCG/ACT inhaler Inhale 1-2 puffs into the lungs every 6 (six) hours as needed for wheezing or shortness of breath. 12/03/15   Hagler, Jami L, PA-C  allopurinol (ZYLOPRIM) 100 MG tablet 1 tab daily for 1 week, then increase to 2 tabs daily for 1 week. Return for recheck blood work on uric acid in 2 weeks. 01/31/15   Johnson, Megan P, DO  cephALEXin (KEFLEX) 500 MG capsule  Take 1 capsule (500 mg total) by mouth 4 (four) times daily. 12/26/16 01/05/17  Enid Derry, PA-C  colchicine 0.6 MG tablet 2 tabs now. Then 1 tab 1 hour later. May repeat after 3 days if not better. 12 hours after better, 1 tab BID 01/25/15   Johnson, Megan P, DO  cyclobenzaprine (FLEXERIL) 5 MG tablet Take 1 tablet (5 mg total) by mouth 3 (three) times daily as needed for muscle spasms. 12/20/16   Menshew, Charlesetta Ivory, PA-C  ibuprofen (ADVIL,MOTRIN) 600 MG tablet Take 1 tablet (600 mg total) by mouth every 8 (eight) hours as needed. 07/31/16   Tommi Rumps, PA-C  promethazine-dextromethorphan (PROMETHAZINE-DM) 6.25-15 MG/5ML syrup Take 5 mLs by mouth 4 (four) times daily as needed for cough. 12/03/15   Hagler, Jami L, PA-C  ranitidine (ZANTAC) 150 MG tablet Take 1 tablet (150 mg total) by mouth 2 (two) times daily. 12/03/15 01/02/16  Hagler, Jami L, PA-C    REVIEW OF SYSTEMS:  Review of Systems  Constitutional: Negative for chills, fever, malaise/fatigue and weight loss.  HENT: Negative for ear pain, hearing loss and tinnitus.   Eyes: Negative for blurred vision, double vision, pain and redness.  Respiratory: Negative for cough, hemoptysis and shortness of breath.   Cardiovascular: Negative for chest pain, palpitations, orthopnea and leg swelling.  Gastrointestinal: Negative for abdominal  pain, constipation, diarrhea, nausea and vomiting.  Genitourinary: Negative for dysuria, frequency and hematuria.  Musculoskeletal: Positive for joint pain (left knee3). Negative for back pain and neck pain.  Skin:       No acne, rash, or lesions  Neurological: Negative for dizziness, tremors, focal weakness and weakness.  Endo/Heme/Allergies: Negative for polydipsia. Does not bruise/bleed easily.  Psychiatric/Behavioral: Negative for depression. The patient is not nervous/anxious and does not have insomnia.      VITAL SIGNS:   Vitals:   12/31/16 1945 12/31/16 1947  BP:  (!) 161/89  Pulse:   92  Resp:  16  Temp:  98.2 F (36.8 C)  TempSrc:  Oral  SpO2:  99%  Weight: 95.3 kg (210 lb)    Wt Readings from Last 3 Encounters:  12/31/16 95.3 kg (210 lb)  12/26/16 95.3 kg (210 lb)  12/20/16 95.3 kg (210 lb)    PHYSICAL EXAMINATION:  Physical Exam  Vitals reviewed. Constitutional: He is oriented to person, place, and time. He appears well-developed and well-nourished. No distress.  HENT:  Head: Normocephalic and atraumatic.  Mouth/Throat: Oropharynx is clear and moist.  Eyes: Pupils are equal, round, and reactive to light. Conjunctivae and EOM are normal. No scleral icterus.  Neck: Normal range of motion. Neck supple. No JVD present. No thyromegaly present.  Cardiovascular: Normal rate, regular rhythm and intact distal pulses.  Exam reveals no gallop and no friction rub.   No murmur heard. Respiratory: Effort normal and breath sounds normal. No respiratory distress. He has no wheezes. He has no rales.  GI: Soft. Bowel sounds are normal. He exhibits no distension. There is no tenderness.  Musculoskeletal: Normal range of motion. He exhibits no edema.  No arthritis, no gout  Lymphadenopathy:    He has no cervical adenopathy.  Neurological: He is alert and oriented to person, place, and time. No cranial nerve deficit.  No dysarthria, no aphasia  Skin: Skin is warm and dry. No rash noted. There is erythema (and tenderness around repaired laceration of left knee).  Psychiatric: He has a normal mood and affect. His behavior is normal. Judgment and thought content normal.    LABORATORY PANEL:   CBC  Recent Labs Lab 12/31/16 2057  WBC 8.5  HGB 15.2  HCT 44.4  PLT 285   ------------------------------------------------------------------------------------------------------------------  Chemistries   Recent Labs Lab 12/31/16 2057  NA 136  K 3.4*  CL 100*  CO2 22  GLUCOSE 145*  BUN 14  CREATININE 0.99  CALCIUM 9.7  AST 40  ALT 60  ALKPHOS 69  BILITOT 0.6    ------------------------------------------------------------------------------------------------------------------  Cardiac Enzymes No results for input(s): TROPONINI in the last 168 hours. ------------------------------------------------------------------------------------------------------------------  RADIOLOGY:  [place] Extrem Low Left Ltd  Result Date: 12/31/2016 CLINICAL DATA:  Worsening wounds to left anterior thigh with edema and pain EXAM: ULTRASOUND left LOWER EXTREMITY LIMITED TECHNIQUE: Ultrasound examination of the lower extremity soft tissues was performed in the area of clinical concern. COMPARISON:  Radiograph 12/31/2016 FINDINGS: Diffuse soft tissue edema within the left lateral knee. Complex fluid collection within the left lateral knee soft tissues measuring 4.4 x 1.3 x 2.2 cm. This appears contiguous with a linear tract extending toward the skin surface. IMPRESSION: 4.4 cm complex fluid collection within the soft tissues of the left lateral knee, with probable sinus tract extending toward the skin surface at its inferior extent. Findings could be secondary to soft tissue abscess, or possibly infected hematoma. Electronically Signed   By: Jasmine Pang  M.D.   On: 12/31/2016 21:50   Dg Knee Complete 4 Views Left  Result Date: 12/31/2016 CLINICAL DATA:  Manson PasseyBrown drainage from wound at the left knee EXAM: LEFT KNEE - COMPLETE 4+ VIEW COMPARISON:  12/26/2016 FINDINGS: No fracture or malalignment. Joint space compartments are maintained. Soft tissue edema. No soft tissue gas. IMPRESSION: No acute osseous abnormality Electronically Signed   By: Jasmine PangKim  Fujinaga M.D.   On: 12/31/2016 21:14    EKG:   Orders placed or performed during the hospital encounter of 02/26/16  . ED EKG  . ED EKG  . EKG    IMPRESSION AND PLAN:  Principal Problem:   Cellulitis and abscess of left leg - IV antibiotics, CT imaging ordered.  If Abscess present will get appropriate surgical consult Active  Problems:   Gout - patient treats PRN, uric acid level checked in ED tonight elevated, though historically patient's levels have been elevated.  He has allopurinol on his med list, but does not take it.   GERD (gastroesophageal reflux disease) - home dose H2 Blocker  All the records are reviewed and case discussed with ED provider. Management plans discussed with the patient and/or family.  DVT PROPHYLAXIS: SubQ lovenox  GI PROPHYLAXIS: H2 Blocker  ADMISSION STATUS: Observation  CODE STATUS: Full Code Status History    This patient does not have a recorded code status. Please follow your organizational policy for patients in this situation.      TOTAL TIME TAKING CARE OF THIS PATIENT: 40 minutes.   Robertine Kipper FIELDING 12/31/2016, 11:00 PM  [facility]  Office  [phone]  CC: Primary care physician; Patient, No Pcp Per  Note:  This document was prepared using Dragon voice recognition software and may include unintentional dictation errors.

## 2016-12-31 NOTE — ED Notes (Signed)
Pt reports that he cut his left upper leg 2 weeks ago and came to the ED for treatment - pt states the area became infected and he came to the ED again for treatment - pt called [facility] and they advised him to come to the ER for eval - he was told that he needed an ultrasound because the area is still bleeding - he is c/o pain in thigh - area has intact sutures but is red and slightly swollen

## 2016-12-31 NOTE — ED Triage Notes (Signed)
Pt to ED for wound recheck due to brown drainage around previous placed sutures above the left knee. Pt was seen for same last week and cleared. Pt is due to have sutures removed now. On assessment pts wound is red around sutures, no drainage noted but small amount of dried blood on bandage.

## 2017-01-01 ENCOUNTER — Observation Stay: Payer: Managed Care, Other (non HMO)

## 2017-01-01 LAB — BASIC METABOLIC PANEL
Anion gap: 8 (ref 5–15)
BUN: 13 mg/dL (ref 6–20)
CO2: 23 mmol/L (ref 22–32)
Calcium: 9 mg/dL (ref 8.9–10.3)
Chloride: 103 mmol/L (ref 101–111)
Creatinine, Ser: 0.95 mg/dL (ref 0.61–1.24)
GFR calc Af Amer: >60 mL/min (ref >60)
GFR calc non Af Amer: >60 mL/min (ref >60)
Glucose, Bld: 104 mg/dL — ABNORMAL HIGH (ref 65–99)
Potassium: 4.2 mmol/L (ref 3.5–5.1)
Sodium: 134 mmol/L — ABNORMAL LOW (ref 135–145)

## 2017-01-01 LAB — CBC
HCT: 42.1 % (ref 40.0–52.0)
Hemoglobin: 14.7 g/dL (ref 13.0–18.0)
MCH: 32.8 pg (ref 26.0–34.0)
MCHC: 34.9 g/dL (ref 32.0–36.0)
MCV: 94.1 fL (ref 80.0–100.0)
Platelets: 272 10*3/uL (ref 150–440)
RBC: 4.48 MIL/uL (ref 4.40–5.90)
RDW: 13.2 % (ref 11.5–14.5)
WBC: 9.4 10*3/uL (ref 3.8–10.6)

## 2017-01-01 LAB — LACTIC ACID, PLASMA: Lactic Acid, Venous: 1.1 mmol/L (ref 0.5–1.9)

## 2017-01-01 MED ORDER — VANCOMYCIN HCL IN DEXTROSE 1-5 GM/200ML-% IV SOLN
1000.0000 mg | Freq: Three times a day (TID) | INTRAVENOUS | Status: DC
Start: 2017-01-01 — End: 2017-01-03
  Administered 2017-01-01 – 2017-01-03 (×5): 1000 mg via INTRAVENOUS
  Filled 2017-01-01 (×6): qty 200

## 2017-01-01 MED ORDER — FAMOTIDINE 20 MG PO TABS
20.0000 mg | ORAL_TABLET | Freq: Two times a day (BID) | ORAL | Status: DC
  Administered 2017-01-01 – 2017-01-03 (×5): 20 mg via ORAL
  Filled 2017-01-01 (×5): qty 1

## 2017-01-01 MED ORDER — ONDANSETRON HCL 4 MG PO TABS: 4.0000 mg | ORAL_TABLET | Freq: Four times a day (QID) | ORAL | Status: DC | PRN

## 2017-01-01 MED ORDER — ONDANSETRON HCL 4 MG/2ML IJ SOLN: 4.0000 mg | Freq: Four times a day (QID) | INTRAMUSCULAR | Status: DC | PRN

## 2017-01-01 MED ORDER — SODIUM CHLORIDE 0.9 % IV SOLN
3.0000 g | Freq: Four times a day (QID) | INTRAVENOUS | Status: DC
  Administered 2017-01-01 – 2017-01-03 (×9): 3 g via INTRAVENOUS
  Filled 2017-01-01 (×12): qty 3

## 2017-01-01 MED ORDER — CLINDAMYCIN PHOSPHATE 600 MG/50ML IV SOLN
600.0000 mg | Freq: Three times a day (TID) | INTRAVENOUS | Status: DC
  Administered 2017-01-01: 600 mg via INTRAVENOUS
  Filled 2017-01-01 (×3): qty 50

## 2017-01-01 MED ORDER — ENOXAPARIN SODIUM 40 MG/0.4ML ~~LOC~~ SOLN: 40.0000 mg | SUBCUTANEOUS | Status: DC

## 2017-01-01 MED ORDER — OXYCODONE HCL 5 MG PO TABS
5.0000 mg | ORAL_TABLET | ORAL | Status: DC | PRN
  Administered 2017-01-01 – 2017-01-03 (×12): 5 mg via ORAL
  Filled 2017-01-01 (×12): qty 1

## 2017-01-01 MED ORDER — ACETAMINOPHEN 325 MG PO TABS
650.0000 mg | ORAL_TABLET | Freq: Four times a day (QID) | ORAL | Status: DC | PRN
  Administered 2017-01-01 – 2017-01-02 (×2): 650 mg via ORAL
  Filled 2017-01-01 (×2): qty 2

## 2017-01-01 MED ORDER — ACETAMINOPHEN 650 MG RE SUPP: 650.0000 mg | Freq: Four times a day (QID) | RECTAL | Status: DC | PRN

## 2017-01-01 MED ORDER — VANCOMYCIN HCL IN DEXTROSE 1-5 GM/200ML-% IV SOLN
1000.0000 mg | Freq: Once | INTRAVENOUS | Status: AC
  Administered 2017-01-01: 1000 mg via INTRAVENOUS
  Filled 2017-01-01: qty 200

## 2017-01-01 MED ORDER — MORPHINE SULFATE (PF) 4 MG/ML IV SOLN
4.0000 mg | INTRAVENOUS | Status: DC | PRN
  Administered 2017-01-01: 4 mg via INTRAVENOUS
  Filled 2017-01-01: qty 1

## 2017-01-01 NOTE — Progress Notes (Signed)
Patient very upset that he is unable to eat or drink anything.  No consults ordered or procedures ordered for today.  Dr.Patel paged to look at his orders to determine if he is going to need an I&D today or if he can go ahead and eat.

## 2017-01-01 NOTE — Procedures (Signed)
US Aspiration performed and yielded only a few drops of old blood c/w hematoma  Complications:  None  Blood Loss: none  See dictation in canopy pacs

## 2017-01-01 NOTE — Progress Notes (Signed)
Patient alert and oriented, vss, having some pain in his left leg/knee area.  Aspiration done today.  Will be NPO after midnight.  Patient given education on infection prevention.

## 2017-01-01 NOTE — Progress Notes (Signed)
Pharmacy Antibiotic Note  Ethan Ethan George is a 41 y.o. male admitted on 12/31/2016 with wound infection.  Pharmacy has been consulted for Unasyn dosing.  Plan: Unasyn 3 gm IV Q6H. Verified with Dr. Allena KatzPatel that patient is to get Unasyn and clindamycin.  Height: 5\' 9"  (175.3 cm) Weight: 213 lb 3.2 oz (96.7 kg) IBW/kg (Calculated) : 70.7  Temp (24hrs), Avg:98.3 F (36.8 C), Min:98.2 F (36.8 C), Max:98.4 F (36.9 C)   Recent Labs Lab 12/31/16 2057 01/01/17 0010 01/01/17 0416  WBC 8.5  --  9.4  CREATININE 0.99  --  0.95  LATICACIDVEN 1.9 1.1  --     Estimated Creatinine Clearance: 119.8 mL/min (by C-G formula based on SCr of 0.95 mg/dL).    Allergies  Allergen Reactions  . Ultram [Tramadol Hcl] Hives    Thank you for allowing pharmacy to be a part of this patient's care.  Ethan FrostNathan A Dyson George, Pharm.D., BCPS Clinical Pharmacist 01/01/2017 10:14 AM

## 2017-01-01 NOTE — Progress Notes (Signed)
[employer] [facility] - [place] at [facility]                                                                                                                                                                                  Patient Demographics   Ethan George, is a 41 y.o. male, DOB - Aug 30, 1975, BMW:[id number]  Admit date - 12/31/2016   Admitting Physician Oralia Manis, MD  Outpatient Primary MD for the patient is Patient, No Pcp Per   LOS - 0  Subjective: Patient admitted with swelling involving his leg near his knee    Review of Systems:   CONSTITUTIONAL: No documented fever. No fatigue, weakness. No weight gain, no weight loss.  EYES: No blurry or double vision.  ENT: No tinnitus. No postnasal drip. No redness of the oropharynx.  RESPIRATORY: No cough, no wheeze, no hemoptysis. No dyspnea.  CARDIOVASCULAR: No chest pain. No orthopnea. No palpitations. No syncope.  GASTROINTESTINAL: No nausea, no vomiting or diarrhea. No abdominal pain. No melena or hematochezia.  GENITOURINARY: No dysuria or hematuria.  ENDOCRINE: No polyuria or nocturia. No heat or cold intolerance.  HEMATOLOGY: No anemia. No bruising. No bleeding.  INTEGUMENTARY: Positive swelling around left knee MUSCULOSKELETAL: No arthritis. No swelling. No gout.  NEUROLOGIC: No numbness, tingling, or ataxia. No seizure-type activity.  PSYCHIATRIC: No anxiety. No insomnia. No ADD.    Vitals:   Vitals:   12/31/16 2329 12/31/16 2351 01/01/17 1322 01/01/17 1344  BP: 113/73 (!) 146/96 (!) 144/89 (!) 157/89  Pulse: 71 73 63 65  Resp: 18 18 18 18   Temp:  98.4 F (36.9 C)    TempSrc:  Oral    SpO2: 95% 98% 98% 96%  Weight:  213 lb 3.2 oz (96.7 kg)    Height:  5\' 9"  (1.753 m)      Wt Readings from Last 3 Encounters:  12/31/16 213 lb 3.2 oz (96.7 kg)  12/26/16 210 lb (95.3 kg)  12/20/16 210 lb (95.3 kg)     Intake/Output Summary (Last 24 hours) at 01/01/17 1613 Last data filed at 01/01/17 1528  Gross per  24 hour  Intake             1780 ml  Output                0 ml  Net             1780 ml    Physical Exam:   GENERAL: Pleasant-appearing in no apparent distress.  HEAD, EYES, EARS, NOSE AND THROAT: Atraumatic, normocephalic. Extraocular muscles are intact. Pupils equal and reactive to light. Sclerae anicteric. No conjunctival injection. No oro-pharyngeal erythema.  NECK: Supple.  There is no jugular venous distention. No bruits, no lymphadenopathy, no thyromegaly.  HEART: Regular rate and rhythm,. No murmurs, no rubs, no clicks.  LUNGS: Clear to auscultation bilaterally. No rales or rhonchi. No wheezes.  ABDOMEN: Soft, flat, nontender, nondistended. Has good bowel sounds. No hepatosplenomegaly appreciated.  EXTREMITIES: No evidence of any cyanosis, clubbing, or peripheral edema.  +2 pedal and radial pulses bilaterally.  NEUROLOGIC: The patient is alert, awake, and oriented x3 with no focal motor or sensory deficits appreciated bilaterally.  SKIN: His erythema and tenderness around the near his left knee Psych: Not anxious, depressed LN: No inguinal LN enlargement    Antibiotics   Anti-infectives    Start     Dose/Rate Route Frequency Ordered Stop   01/01/17 2200  vancomycin (VANCOCIN) IVPB 1000 mg/200 mL premix     1,000 mg 200 mL/hr over 60 Minutes Intravenous Every 8 hours 01/01/17 1425     01/01/17 1600  vancomycin (VANCOCIN) IVPB 1000 mg/200 mL premix     1,000 mg 200 mL/hr over 60 Minutes Intravenous  Once 01/01/17 1425     01/01/17 1100  Ampicillin-Sulbactam (UNASYN) 3 g in sodium chloride 0.9 % 100 mL IVPB     3 g 200 mL/hr over 30 Minutes Intravenous Every 6 hours 01/01/17 1014     01/01/17 0600  clindamycin (CLEOCIN) IVPB 600 mg  Status:  Discontinued     600 mg 100 mL/hr over 30 Minutes Intravenous Every 8 hours 01/01/17 0025 01/01/17 1420   12/31/16 2230  clindamycin (CLEOCIN) IVPB 600 mg     600 mg 100 mL/hr over 30 Minutes Intravenous  Once 12/31/16 2223 12/31/16  2324   12/31/16 0000  clindamycin (CLEOCIN) 300 MG capsule  Status:  Discontinued     300 mg Oral 4 times daily 12/31/16 2226 12/31/16       Medications   Scheduled Meds: . enoxaparin (LOVENOX) injection  40 mg Subcutaneous Q24H  . famotidine  20 mg Oral BID   Continuous Infusions: . ampicillin-sulbactam (UNASYN) IV Stopped (01/01/17 1130)  . vancomycin 1,000 mg (01/01/17 1528)  . vancomycin     PRN Meds:.acetaminophen **OR** acetaminophen, morphine injection, ondansetron **OR** ondansetron (ZOFRAN) IV, oxyCODONE   Data Review:   Micro Results No results found for this or any previous visit (from the past 240 hour(s)).  Radiology Reports [place] Abscess Drain  Result Date: 01/01/2017 INDICATION: Posttraumatic fluid collection along the lateral knee, possible abscess EXAM: ULTRASOUND-GUIDED ASPIRATION OF A SUBCUTANEOUS FLUID COLLECTION MEDICATIONS: NONE ANESTHESIA/SEDATION: NONE COMPLICATIONS: None immediate. PROCEDURE: Informed written consent was obtained from the patient after a thorough discussion of the procedural risks, benefits and alternatives. All questions were addressed. Maximal Sterile Barrier Technique was utilized including caps, mask, sterile gowns, sterile gloves, sterile drape, hand hygiene and skin antiseptic. A timeout was performed prior to the initiation of the procedure. Utilizing 1% xylocaine as local anesthetic and real-time ultrasound guidance an 18 gauge spinal needle was placed percutaneously into the known subcutaneous fluid collection within the lateral aspect of the left knee. Aspiration was performed although no significant fluid was obtained. The needle was advanced throughout the collection in aspiration was performed an yielded only a few drops of old blood. These findings are consistent with a posttraumatic hematoma. No purulent material was withdrawn. IMPRESSION: Aspiration of the left knee fluid collection yields only a few drops of old blood consistent  with post traumatic hematoma. This was sent for culture. No findings to suggest abscess are noted. Electronically Signed  By: Alcide CleverMark  Lukens M.D.   On: 01/01/2017 13:59   Ct Knee Left W Contrast  Result Date: 12/31/2016 CLINICAL DATA:  41 year old male with left knee pain and drainage. EXAM: CT OF THE left KNEE WITH CONTRAST TECHNIQUE: Multidetector CT imaging was performed following the standard protocol during bolus administration of intravenous contrast. COMPARISON:  Left knee radiograph dated 12/31/2016 and ultrasound dated 12/31/2016 FINDINGS: Bones/Joint/Cartilage There is no acute fracture or dislocation. The bones are well mineralized. An area of cortical irregularity in the distal femoral metaphysis noted which may be related to prior surgery or trauma. Ligaments Suboptimally assessed by CT. Muscles and Tendons No traumatic injury. Soft tissues There is diffuse edema and stranding of the subcutaneous soft tissues of the left knee. There is an ill-defined heterogeneous area in the soft tissues of the anterolateral distal femur proximally 7.5 cm above the knee which appears to extend to the skin. There is thickening of the overlying skin. This corresponds to the complex fluid collection seen on the earlier ultrasound and may represent an infected hematoma or a complex inflammatory collection with debris. Clinical correlation is recommended. IMPRESSION: Heterogeneous low attenuating area involving the soft tissues of the anterolateral aspect of the distal femur corresponding to the fluid collection seen on the earlier ultrasound. This likely represents a complex fluid with inflammatory debris. Clinical correlation is recommended. Electronically Signed   By: Elgie CollardArash  Radparvar M.D.   On: 12/31/2016 23:42   [place] Extrem Low Left Ltd  Result Date: 12/31/2016 CLINICAL DATA:  Worsening wounds to left anterior thigh with edema and pain EXAM: ULTRASOUND left LOWER EXTREMITY LIMITED TECHNIQUE: Ultrasound  examination of the lower extremity soft tissues was performed in the area of clinical concern. COMPARISON:  Radiograph 12/31/2016 FINDINGS: Diffuse soft tissue edema within the left lateral knee. Complex fluid collection within the left lateral knee soft tissues measuring 4.4 x 1.3 x 2.2 cm. This appears contiguous with a linear tract extending toward the skin surface. IMPRESSION: 4.4 cm complex fluid collection within the soft tissues of the left lateral knee, with probable sinus tract extending toward the skin surface at its inferior extent. Findings could be secondary to soft tissue abscess, or possibly infected hematoma. Electronically Signed   By: Jasmine PangKim  Fujinaga M.D.   On: 12/31/2016 21:50   Dg Knee Complete 4 Views Left  Result Date: 12/31/2016 CLINICAL DATA:  Brown drainage from wound at the left knee EXAM: LEFT KNEE - COMPLETE 4+ VIEW COMPARISON:  12/26/2016 FINDINGS: No fracture or malalignment. Joint space compartments are maintained. Soft tissue edema. No soft tissue gas. IMPRESSION: No acute osseous abnormality Electronically Signed   By: Jasmine PangKim  Fujinaga M.D.   On: 12/31/2016 21:14   Dg Knee Complete 4 Views Left  Result Date: 12/26/2016 CLINICAL DATA:  Drainage from left knee sutures EXAM: LEFT KNEE - COMPLETE 4+ VIEW COMPARISON:  None. FINDINGS: No evidence of fracture, dislocation, or joint effusion. No evidence of arthropathy or other focal bone abnormality. Soft tissues are unremarkable. IMPRESSION: No osseous abnormality or soft tissue emphysema. Electronically Signed   By: Deatra RobinsonKevin  Herman M.D.   On: 12/26/2016 22:56     CBC  Recent Labs Lab 12/31/16 2057 01/01/17 0416  WBC 8.5 9.4  HGB 15.2 14.7  HCT 44.4 42.1  PLT 285 272  MCV 93.3 94.1  MCH 32.0 32.8  MCHC 34.3 34.9  RDW 13.2 13.2  LYMPHSABS 2.0  --   MONOABS 0.6  --   EOSABS 0.4  --   BASOSABS  0.1  --     Chemistries   Recent Labs Lab 12/31/16 2057 01/01/17 0416  NA 136 134*  K 3.4* 4.2  CL 100* 103  CO2 22  23  GLUCOSE 145* 104*  BUN 14 13  CREATININE 0.99 0.95  CALCIUM 9.7 9.0  AST 40  --   ALT 60  --   ALKPHOS 69  --   BILITOT 0.6  --    ------------------------------------------------------------------------------------------------------------------ estimated creatinine clearance is 119.8 mL/min (by C-G formula based on SCr of 0.95 mg/dL). ------------------------------------------------------------------------------------------------------------------ No results for input(s): HGBA1C in the last 72 hours. ------------------------------------------------------------------------------------------------------------------ No results for input(s): CHOL, HDL, LDLCALC, TRIG, CHOLHDL, LDLDIRECT in the last 72 hours. ------------------------------------------------------------------------------------------------------------------ No results for input(s): TSH, T4TOTAL, T3FREE, THYROIDAB in the last 72 hours.  Invalid input(s): FREET3 ------------------------------------------------------------------------------------------------------------------ No results for input(s): VITAMINB12, FOLATE, FERRITIN, TIBC, IRON, RETICCTPCT in the last 72 hours.  Coagulation profile No results for input(s): INR, PROTIME in the last 168 hours.  No results for input(s): DDIMER in the last 72 hours.  Cardiac Enzymes No results for input(s): CKMB, TROPONINI, MYOGLOBIN in the last 168 hours.  Invalid input(s): CK ------------------------------------------------------------------------------------------------------------------ Invalid input(s): POCBNP    Assessment & Plan  Patient is 41 year old with swelling  1. Cellulitis and abscess of left leg - IV antibiotics,  Appreciated vascular input,, they arrange for patient to have the area drained by radiology Findings suggest likely hematoma  2.  Gout - patient treats PRN, no evidence of flare currently  3.   GERD (gastroesophageal reflux disease) -  continue home dose H2 Blocker      Code Status Orders        Start     Ordered   01/01/17 0026  Full code  Continuous     01/01/17 0025    Code Status History    Date Active Date Inactive Code Status Order ID Comments User Context   This patient has a current code status but no historical code status.           Consults ortopedics surgery  DVT Prophylaxis  scd's  Lab Results  Component Value Date   PLT 272 01/01/2017     Time Spent in minutes Greater than 50% of time spent in care coordination and counseling patient regarding the condition and plan of care.   Auburn Bilberry M.D on 01/01/2017 at 4:13 PM  Between 7am to 6pm - Pager - [phone]  After 6pm go to [email or web address] - password [email or web address] [facility]  [facility]   Office  [phone]

## 2017-01-01 NOTE — Consult Note (Addendum)
ORTHOPAEDIC CONSULTATION  REQUESTING PHYSICIAN: Auburn Bilberry, MD  Chief Complaint: Left lateral distal thigh laceration with pain  HPI: Ethan George is a 41 y.o. male who was admitted overnight from the ER after he returns for a third time for symptoms associated with a lateral distal thigh laceration he sustained initially on 12/20/2016.  Patient, his left thigh on a broken soap dish in the shower.  Patient was seen on the second visit on 12/26/2016 and placed on Keflex. Patient returned last night for persistent pain and drainage.  He was admitted to the hospital service for IV antibiotics. He is currently on clindamycin and Unasyn.  Patient feels slight improvement overnight.  He states the majority of his pain is under his kneecap. He is also having cramping involving his left thigh. He denies fever or chills. Patient denies any numbness or tingling.  Past Medical History:  Diagnosis Date  . GERD (gastroesophageal reflux disease)   . Gout    Past Surgical History:  Procedure Laterality Date  . BACK SURGERY  2012,2016   X 2   Social History   Social History  . Marital status: Single    Spouse name: N/A  . Number of children: N/A  . Years of education: N/A   Social History Main Topics  . Smoking status: Current Every Day Smoker    Packs/day: 1.50    Years: 20.00    Types: Cigarettes  . Smokeless tobacco: Never Used  . Alcohol use No  . Drug use: No  . Sexual activity: Yes   Other Topics Concern  . None   Social History Narrative  . None   Family History  Problem Relation Age of Onset  . Heart attack Maternal Grandfather    Allergies  Allergen Reactions  . Ultram [Tramadol Hcl] Hives   Prior to Admission medications   Medication Sig Start Date End Date Taking? Authorizing Provider  cephALEXin (KEFLEX) 500 MG capsule Take 1 capsule (500 mg total) by mouth 4 (four) times daily. 12/26/16 01/05/17 Yes Enid Derry, PA-C  cyclobenzaprine (FLEXERIL) 5  MG tablet Take 1 tablet (5 mg total) by mouth 3 (three) times daily as needed for muscle spasms. 12/20/16  Yes Menshew, Charlesetta Ivory, PA-C  ibuprofen (ADVIL,MOTRIN) 600 MG tablet Take 1 tablet (600 mg total) by mouth every 8 (eight) hours as needed. 07/31/16  Yes Tommi Rumps, PA-C  omeprazole (PRILOSEC) 20 MG capsule Take 20 mg by mouth daily.   Yes [provider]  ranitidine (ZANTAC) 150 MG tablet Take 1 tablet (150 mg total) by mouth 2 (two) times daily. 12/03/15 01/02/16  Hagler, Ernestene Kiel, PA-C   Ct Knee Left W Contrast  Result Date: 12/31/2016 CLINICAL DATA:  41 year old male with left knee pain and drainage. EXAM: CT OF THE left KNEE WITH CONTRAST TECHNIQUE: Multidetector CT imaging was performed following the standard protocol during bolus administration of intravenous contrast. COMPARISON:  Left knee radiograph dated 12/31/2016 and ultrasound dated 12/31/2016 FINDINGS: Bones/Joint/Cartilage There is no acute fracture or dislocation. The bones are well mineralized. An area of cortical irregularity in the distal femoral metaphysis noted which may be related to prior surgery or trauma. Ligaments Suboptimally assessed by CT. Muscles and Tendons No traumatic injury. Soft tissues There is diffuse edema and stranding of the subcutaneous soft tissues of the left knee. There is an ill-defined heterogeneous area in the soft tissues of the anterolateral distal femur proximally 7.5 cm above the knee which appears to extend to the  skin. There is thickening of the overlying skin. This corresponds to the complex fluid collection seen on the earlier ultrasound and may represent an infected hematoma or a complex inflammatory collection with debris. Clinical correlation is recommended. IMPRESSION: Heterogeneous low attenuating area involving the soft tissues of the anterolateral aspect of the distal femur corresponding to the fluid collection seen on the earlier ultrasound. This likely represents a  complex fluid with inflammatory debris. Clinical correlation is recommended. Electronically Signed   By: Elgie CollardArash  Radparvar M.D.   On: 12/31/2016 23:42   [place] Extrem Low Left Ltd  Result Date: 12/31/2016 CLINICAL DATA:  Worsening wounds to left anterior thigh with edema and pain EXAM: ULTRASOUND left LOWER EXTREMITY LIMITED TECHNIQUE: Ultrasound examination of the lower extremity soft tissues was performed in the area of clinical concern. COMPARISON:  Radiograph 12/31/2016 FINDINGS: Diffuse soft tissue edema within the left lateral knee. Complex fluid collection within the left lateral knee soft tissues measuring 4.4 x 1.3 x 2.2 cm. This appears contiguous with a linear tract extending toward the skin surface. IMPRESSION: 4.4 cm complex fluid collection within the soft tissues of the left lateral knee, with probable sinus tract extending toward the skin surface at its inferior extent. Findings could be secondary to soft tissue abscess, or possibly infected hematoma. Electronically Signed   By: Jasmine PangKim  Fujinaga M.D.   On: 12/31/2016 21:50   Dg Knee Complete 4 Views Left  Result Date: 12/31/2016 CLINICAL DATA:  Brown drainage from wound at the left knee EXAM: LEFT KNEE - COMPLETE 4+ VIEW COMPARISON:  12/26/2016 FINDINGS: No fracture or malalignment. Joint space compartments are maintained. Soft tissue edema. No soft tissue gas. IMPRESSION: No acute osseous abnormality Electronically Signed   By: Jasmine PangKim  Fujinaga M.D.   On: 12/31/2016 21:14    Positive ROS: All other systems have been reviewed and were otherwise negative with the exception of those mentioned in the HPI and as above.  Physical Exam: General: Alert, no acute distress  MUSCULOSKELETAL: Left thigh: Patient has a 3 cm transverse laceration over the distal lateral thigh. There is no active drainage. Patient has sutures in place.  The left knee does not have any significant effusion. There is no erythema or ecchymosis. He has tenderness over the  lateral thigh distally.  His thigh and leg compartments are soft and compressible. He has intact sensation light touch in palpable pedal pulses with intact motor function of the left lower extremity. Patient can fully extend his left knee and can flex to approximately 80 with pain in full flexion.  Assessment: Left thigh cellulitis versus abscess  Plan: Patient had an ultrasound and CT scan performed in the ER last night. I reviewed these studies. Patient has a fluid collection over the lateral thigh stemming from the area of the laceration. This could indicate hematoma or abscess. I would recommend continuing IV antibiotics for a minimum of 24 hours. I like to see how the patient responds to these antibiotics to see if his condition improves.  He is afebrile and his white count is normal.  Would recommend an infectious disease consult. I will see the patient again tomorrow to determine whether he requires surgical intervention. He should be nothing by mouth after midnight in case surgery is warranted. I discussed this plan with the patient and he understands and agrees with this plan.  Addendum:  Spoke with Dr. Elige KoHetal Patel from radiology and reviewed CT scan with him.  We agreed that an aspiration would be helpful.  I am going to order an ultrasound guided aspiration of the left thigh fluid and have it sent to the lab for analysis.  A further treatment plan will be devised based on the results of this aspiration.   Juanell Fairly, MD    01/01/2017 12:30 PM

## 2017-01-01 NOTE — Progress Notes (Signed)
Pharmacy Antibiotic Note  Ethan CrumbleRodney William George is a 41 y.o. male admitted on 12/31/2016 with wound infection.  Pharmacy has been consulted for Unasyn and vancomycin dosing.  Plan: Unasyn 3 gm IV Q6H. Verified with Dr. Allena KatzPatel that patient is to get Unasyn and clindamycin.  01/01/17 14:26 ID discontinues clindamycin and enters PTD vancomycin. Start vancomycin 1 gm IV x 1 followed in approximately 6 hours (stacked dosing) by vancomycin 1 gm IV Q8H, predicted trough 18 mcg/mL. Pharmacy will continue to follow and adjust as needed to maintain trough 15 to 20 mcg/mL (abscess vs infected hematoma).   Vd. 56.8 L, Ke 0.089 hr-1, T1/2 7.8 hr  Height: 5\' 9"  (175.3 cm) Weight: 213 lb 3.2 oz (96.7 kg) IBW/kg (Calculated) : 70.7  Temp (24hrs), Avg:98.3 F (36.8 C), Min:98.2 F (36.8 C), Max:98.4 F (36.9 C)   Recent Labs Lab 12/31/16 2057 01/01/17 0010 01/01/17 0416  WBC 8.5  --  9.4  CREATININE 0.99  --  0.95  LATICACIDVEN 1.9 1.1  --     Estimated Creatinine Clearance: 119.8 mL/min (by C-G formula based on SCr of 0.95 mg/dL).    Allergies  Allergen Reactions  . Ultram [Tramadol Hcl] Hives    Thank you for allowing pharmacy to be a part of this patient's care.  Carola FrostNathan A Leisl Spurrier, Pharm.D., BCPS Clinical Pharmacist 01/01/2017 2:25 PM

## 2017-01-01 NOTE — Consult Note (Signed)
[facility]     Reason for Consult: Infected wound   Referring Physician: Wynetta Emery Date of Admission:  12/31/2016   Principal Problem:   Cellulitis and abscess of left leg Active Problems:   Gout   GERD (gastroesophageal reflux disease)   HPI: Ethan George is a 41 y.o. male admitted with L lateral knee wound and pain and swelling around it.  He had laceration of l lateral on ceramic soap dish and seen in ED 10/13 and had active bleeding. Had sutures placed.  10/17 had wound check and noticed increased drainage and pain around site. Given keflex.  Seen again 10/22 with increasing pain and erythema and drainage.  WBC nml, no fevers. CT showed possible soft tissue fluid. Had attempted aspiration but only dried blood obtained.   Past Medical History:  Diagnosis Date  . GERD (gastroesophageal reflux disease)   . Gout    Past Surgical History:  Procedure Laterality Date  . BACK SURGERY  2012,2016   X 2   Social History  Substance Use Topics  . Smoking status: Current Every Day Smoker    Packs/day: 1.50    Years: 20.00    Types: Cigarettes  . Smokeless tobacco: Never Used  . Alcohol use No   Family History  Problem Relation Age of Onset  . Heart attack Maternal Grandfather     Allergies:  Allergies  Allergen Reactions  . Ultram [Tramadol Hcl] Hives    Current antibiotics: Antibiotics Given (last 72 hours)    Date/Time Action Medication Dose Rate   12/31/16 2238 New Bag/Given   clindamycin (CLEOCIN) IVPB 600 mg 600 mg 100 mL/hr   01/01/17 0504 New Bag/Given   clindamycin (CLEOCIN) IVPB 600 mg 600 mg 100 mL/hr   01/01/17 1100 New Bag/Given   Ampicillin-Sulbactam (UNASYN) 3 g in sodium chloride 0.9 % 100 mL IVPB 3 g 200 mL/hr      MEDICATIONS: . enoxaparin (LOVENOX) injection  40 mg Subcutaneous Q24H  . famotidine  20 mg Oral BID    Review of Systems - 11 systems reviewed and negative per HPI   OBJECTIVE: Temp:  [98.2 F (36.8  C)-98.4 F (36.9 C)] 98.4 F (36.9 C) (10/22 2351) Pulse Rate:  [63-92] 65 (10/23 1344) Resp:  [16-18] 18 (10/23 1344) BP: (113-161)/(73-96) 157/89 (10/23 1344) SpO2:  [95 %-99 %] 96 % (10/23 1344) Weight:  [95.3 kg (210 lb)-96.7 kg (213 lb 3.2 oz)] 96.7 kg (213 lb 3.2 oz) (10/22 2351) Physical Exam  Constitutional: He is oriented to person, place, and time. He appears well-developed and well-nourished. No distress.  HENT:  Mouth/Throat: Oropharynx is clear and moist. No oropharyngeal exudate.  Cardiovascular: Normal rate, regular rhythm and normal heart sounds. Exam reveals no gallop and no friction rub.  No murmur heard.  Pulmonary/Chest: Effort normal and breath sounds normal. No respiratory distress. He has no wheezes.  Abdominal: Soft. Bowel sounds are normal. He exhibits no distension. There is no tenderness.  Lymphadenopathy: He has no cervical adenopathy.  Neurological: He is alert and oriented to person, place, and time.  Skin: L leg with sutures above knee, with some dried blood, mod ttp Psychiatric: He has a normal mood and affect. His behavior is normal.     LABS: Results for orders placed or performed during the hospital encounter of 12/31/16 (from the past 48 hour(s))  Comprehensive metabolic panel     Status: Abnormal   Collection Time: 12/31/16  8:57 PM  Result Value Ref  Range   Sodium 136 135 - 145 mmol/L   Potassium 3.4 (L) 3.5 - 5.1 mmol/L   Chloride 100 (L) 101 - 111 mmol/L   CO2 22 22 - 32 mmol/L   Glucose, Bld 145 (H) 65 - 99 mg/dL   BUN 14 6 - 20 mg/dL   Creatinine, Ser 0.99 0.61 - 1.24 mg/dL   Calcium 9.7 8.9 - 10.3 mg/dL   Total Protein 8.2 (H) 6.5 - 8.1 g/dL   Albumin 4.9 3.5 - 5.0 g/dL   AST 40 15 - 41 U/L   ALT 60 17 - 63 U/L   Alkaline Phosphatase 69 38 - 126 U/L   Total Bilirubin 0.6 0.3 - 1.2 mg/dL   GFR calc non Af Amer >60 >60 mL/min   GFR calc Af Amer >60 >60 mL/min    Comment: (NOTE) The eGFR has been calculated using the CKD EPI  equation. This calculation has not been validated in all clinical situations. eGFR's persistently <60 mL/min signify possible Chronic Kidney Disease.    Anion gap 14 5 - 15  CBC with Differential     Status: None   Collection Time: 12/31/16  8:57 PM  Result Value Ref Range   WBC 8.5 3.8 - 10.6 K/uL   RBC 4.76 4.40 - 5.90 MIL/uL   Hemoglobin 15.2 13.0 - 18.0 g/dL   HCT 44.4 40.0 - 52.0 %   MCV 93.3 80.0 - 100.0 fL   MCH 32.0 26.0 - 34.0 pg   MCHC 34.3 32.0 - 36.0 g/dL   RDW 13.2 11.5 - 14.5 %   Platelets 285 150 - 440 K/uL   Neutrophils Relative % 63 %   Neutro Abs 5.4 1.4 - 6.5 K/uL   Lymphocytes Relative 24 %   Lymphs Abs 2.0 1.0 - 3.6 K/uL   Monocytes Relative 7 %   Monocytes Absolute 0.6 0.2 - 1.0 K/uL   Eosinophils Relative 5 %   Eosinophils Absolute 0.4 0 - 0.7 K/uL   Basophils Relative 1 %   Basophils Absolute 0.1 0 - 0.1 K/uL  Lactic acid, plasma     Status: None   Collection Time: 12/31/16  8:57 PM  Result Value Ref Range   Lactic Acid, Venous 1.9 0.5 - 1.9 mmol/L  Uric acid     Status: Abnormal   Collection Time: 12/31/16  8:57 PM  Result Value Ref Range   Uric Acid, Serum 10.5 (H) 4.4 - 7.6 mg/dL  Lactic acid, plasma     Status: None   Collection Time: 01/01/17 12:10 AM  Result Value Ref Range   Lactic Acid, Venous 1.1 0.5 - 1.9 mmol/L  Basic metabolic panel     Status: Abnormal   Collection Time: 01/01/17  4:16 AM  Result Value Ref Range   Sodium 134 (L) 135 - 145 mmol/L   Potassium 4.2 3.5 - 5.1 mmol/L   Chloride 103 101 - 111 mmol/L   CO2 23 22 - 32 mmol/L   Glucose, Bld 104 (H) 65 - 99 mg/dL   BUN 13 6 - 20 mg/dL   Creatinine, Ser 0.95 0.61 - 1.24 mg/dL   Calcium 9.0 8.9 - 10.3 mg/dL   GFR calc non Af Amer >60 >60 mL/min   GFR calc Af Amer >60 >60 mL/min    Comment: (NOTE) The eGFR has been calculated using the CKD EPI equation. This calculation has not been validated in all clinical situations. eGFR's persistently <60 mL/min signify possible  Chronic Kidney Disease.  Anion gap 8 5 - 15  CBC     Status: None   Collection Time: 01/01/17  4:16 AM  Result Value Ref Range   WBC 9.4 3.8 - 10.6 K/uL   RBC 4.48 4.40 - 5.90 MIL/uL   Hemoglobin 14.7 13.0 - 18.0 g/dL   HCT 42.1 40.0 - 52.0 %   MCV 94.1 80.0 - 100.0 fL   MCH 32.8 26.0 - 34.0 pg   MCHC 34.9 32.0 - 36.0 g/dL   RDW 13.2 11.5 - 14.5 %   Platelets 272 150 - 440 K/uL   No components found for: ESR, C REACTIVE PROTEIN MICRO: No results found for this or any previous visit (from the past 720 hour(s)).  IMAGING: [place] Abscess Drain  Result Date: 01/01/2017 INDICATION: Posttraumatic fluid collection along the lateral knee, possible abscess EXAM: ULTRASOUND-GUIDED ASPIRATION OF A SUBCUTANEOUS FLUID COLLECTION MEDICATIONS: NONE ANESTHESIA/SEDATION: NONE COMPLICATIONS: None immediate. PROCEDURE: Informed written consent was obtained from the patient after a thorough discussion of the procedural risks, benefits and alternatives. All questions were addressed. Maximal Sterile Barrier Technique was utilized including caps, mask, sterile gowns, sterile gloves, sterile drape, hand hygiene and skin antiseptic. A timeout was performed prior to the initiation of the procedure. Utilizing 1% xylocaine as local anesthetic and real-time ultrasound guidance an 18 gauge spinal needle was placed percutaneously into the known subcutaneous fluid collection within the lateral aspect of the left knee. Aspiration was performed although no significant fluid was obtained. The needle was advanced throughout the collection in aspiration was performed an yielded only a few drops of old blood. These findings are consistent with a posttraumatic hematoma. No purulent material was withdrawn. IMPRESSION: Aspiration of the left knee fluid collection yields only a few drops of old blood consistent with post traumatic hematoma. This was sent for culture. No findings to suggest abscess are noted. Electronically Signed    By: Inez Catalina M.D.   On: 01/01/2017 13:59   Ct Knee Left W Contrast  Result Date: 12/31/2016 CLINICAL DATA:  41 year old male with left knee pain and drainage. EXAM: CT OF THE left KNEE WITH CONTRAST TECHNIQUE: Multidetector CT imaging was performed following the standard protocol during bolus administration of intravenous contrast. COMPARISON:  Left knee radiograph dated 12/31/2016 and ultrasound dated 12/31/2016 FINDINGS: Bones/Joint/Cartilage There is no acute fracture or dislocation. The bones are well mineralized. An area of cortical irregularity in the distal femoral metaphysis noted which may be related to prior surgery or trauma. Ligaments Suboptimally assessed by CT. Muscles and Tendons No traumatic injury. Soft tissues There is diffuse edema and stranding of the subcutaneous soft tissues of the left knee. There is an ill-defined heterogeneous area in the soft tissues of the anterolateral distal femur proximally 7.5 cm above the knee which appears to extend to the skin. There is thickening of the overlying skin. This corresponds to the complex fluid collection seen on the earlier ultrasound and may represent an infected hematoma or a complex inflammatory collection with debris. Clinical correlation is recommended. IMPRESSION: Heterogeneous low attenuating area involving the soft tissues of the anterolateral aspect of the distal femur corresponding to the fluid collection seen on the earlier ultrasound. This likely represents a complex fluid with inflammatory debris. Clinical correlation is recommended. Electronically Signed   By: Anner Crete M.D.   On: 12/31/2016 23:42   [place] Extrem Low Left Ltd  Result Date: 12/31/2016 CLINICAL DATA:  Worsening wounds to left anterior thigh with edema and pain EXAM: ULTRASOUND left LOWER  EXTREMITY LIMITED TECHNIQUE: Ultrasound examination of the lower extremity soft tissues was performed in the area of clinical concern. COMPARISON:  Radiograph 12/31/2016  FINDINGS: Diffuse soft tissue edema within the left lateral knee. Complex fluid collection within the left lateral knee soft tissues measuring 4.4 x 1.3 x 2.2 cm. This appears contiguous with a linear tract extending toward the skin surface. IMPRESSION: 4.4 cm complex fluid collection within the soft tissues of the left lateral knee, with probable sinus tract extending toward the skin surface at its inferior extent. Findings could be secondary to soft tissue abscess, or possibly infected hematoma. Electronically Signed   By: Donavan Foil M.D.   On: 12/31/2016 21:50   Dg Knee Complete 4 Views Left  Result Date: 12/31/2016 CLINICAL DATA:  Brown drainage from wound at the left knee EXAM: LEFT KNEE - COMPLETE 4+ VIEW COMPARISON:  12/26/2016 FINDINGS: No fracture or malalignment. Joint space compartments are maintained. Soft tissue edema. No soft tissue gas. IMPRESSION: No acute osseous abnormality Electronically Signed   By: Donavan Foil M.D.   On: 12/31/2016 21:14   Dg Knee Complete 4 Views Left  Result Date: 12/26/2016 CLINICAL DATA:  Drainage from left knee sutures EXAM: LEFT KNEE - COMPLETE 4+ VIEW COMPARISON:  None. FINDINGS: No evidence of fracture, dislocation, or joint effusion. No evidence of arthropathy or other focal bone abnormality. Soft tissues are unremarkable. IMPRESSION: No osseous abnormality or soft tissue emphysema. Electronically Signed   By: Ulyses Jarred M.D.   On: 12/26/2016 22:56    Assessment:   Ethan George is a 41 y.o. male with L leg soft tissue hematoma and infection after laceration several weeks ago. Failed otpt keflex therapy. Cx done today from dried blood obtained on drainage and I cultured the wound today.  Recommendations Change clinda to vanco pending cultures Cont unasyn. Likely can transition to orals tomorrow. Thank you very much for allowing me to participate in the care of this patient. Please call with questions.   Cheral Marker. Ola Spurr, MD

## 2017-01-02 ENCOUNTER — Encounter
Admission: EM | Disposition: A | Discharge status: Home / Self Care | Payer: Self-pay | Source: Home / Self Care | Attending: Emergency Medicine

## 2017-01-02 LAB — CBC
HCT: 41 % (ref 40.0–52.0)
Hemoglobin: 14.4 g/dL (ref 13.0–18.0)
MCH: 32.8 pg (ref 26.0–34.0)
MCHC: 35.1 g/dL (ref 32.0–36.0)
MCV: 93.3 fL (ref 80.0–100.0)
Platelets: 244 K/uL (ref 150–440)
RBC: 4.39 MIL/uL — ABNORMAL LOW (ref 4.40–5.90)
RDW: 13.4 % (ref 11.5–14.5)
WBC: 5.9 K/uL (ref 3.8–10.6)

## 2017-01-02 LAB — BASIC METABOLIC PANEL WITH GFR
Anion gap: 8 (ref 5–15)
BUN: 16 mg/dL (ref 6–20)
CO2: 25 mmol/L (ref 22–32)
Calcium: 8.8 mg/dL — ABNORMAL LOW (ref 8.9–10.3)
Chloride: 103 mmol/L (ref 101–111)
Creatinine, Ser: 0.96 mg/dL (ref 0.61–1.24)
GFR calc Af Amer: >60 mL/min
GFR calc non Af Amer: >60 mL/min
Glucose, Bld: 131 mg/dL — ABNORMAL HIGH (ref 65–99)
Potassium: 4 mmol/L (ref 3.5–5.1)
Sodium: 136 mmol/L (ref 135–145)

## 2017-01-02 LAB — HIV ANTIBODY (ROUTINE TESTING W REFLEX): HIV Screen 4th Generation wRfx: NONREACTIVE

## 2017-01-02 SURGERY — ARTHROSCOPY, KNEE
Anesthesia: Choice | Laterality: Left

## 2017-01-02 NOTE — Progress Notes (Signed)
Subjective:  Patient had his left thigh aspirated under ultrasound guidance last night. This only revealed hematoma. There was no evidence of purulent fluid.  Sample is reported to have been sent to the lab. Dr. Sampson Goon is seen the patient and took a swab of the laceration and sent this for Gram stain and culture as well. Gram stain has shown white blood cells without organisms. Patient reports left thigh pain as mild to moderate.  He states his pain has improved and he has better motion of the left knee today. Patient's family is at the bedside.  Objective:   VITALS:   Vitals:   01/01/17 1322 01/01/17 1344 01/01/17 1921 01/02/17 0800  BP: (!) 144/89 (!) 157/89 (!) 149/81 115/84  Pulse: 63 65 71 61  Resp: 18 18 18 18   Temp:   98.3 F (36.8 C) 98 F (36.7 C)  TempSrc:   Oral Oral  SpO2: 98% 96% 96% 97%  Weight:      Height:        PHYSICAL EXAM: Left lower extremity: There is no active drainage from patient's laceration. He still has suture material in place. Patient has no erythema or ecchymosis or swelling. There is no lymphangitis. He remains neurovascularly intact distally in the left foot. Patient can flex to possibly 90 today fully extend actively without pain.   LABS  Results for orders placed or performed during the hospital encounter of 12/31/16 (from the past 24 hour(s))  Aerobic/Anaerobic Culture (surgical/deep wound)     Status: None (Preliminary result)   Collection Time: 01/01/17  1:40 PM  Result Value Ref Range   Specimen Description KNEE    Special Requests Normal    Gram Stain      RARE WBC PRESENT,BOTH PMN AND MONONUCLEAR NO ORGANISMS SEEN Performed at [facility], [address] [place], [place] [address]    Culture PENDING    Report Status PENDING   CBC     Status: Abnormal   Collection Time: 01/02/17  5:16 AM  Result Value Ref Range   WBC 5.9 3.8 - 10.6 K/uL   RBC 4.39 (L) 4.40 - 5.90 MIL/uL   Hemoglobin 14.4 13.0 - 18.0 g/dL   HCT 62.9  52.8 - 41.3 %   MCV 93.3 80.0 - 100.0 fL   MCH 32.8 26.0 - 34.0 pg   MCHC 35.1 32.0 - 36.0 g/dL   RDW 24.4 01.0 - 27.2 %   Platelets 244 150 - 440 K/uL  Basic metabolic panel     Status: Abnormal   Collection Time: 01/02/17  5:16 AM  Result Value Ref Range   Sodium 136 135 - 145 mmol/L   Potassium 4.0 3.5 - 5.1 mmol/L   Chloride 103 101 - 111 mmol/L   CO2 25 22 - 32 mmol/L   Glucose, Bld 131 (H) 65 - 99 mg/dL   BUN 16 6 - 20 mg/dL   Creatinine, Ser 5.36 0.61 - 1.24 mg/dL   Calcium 8.8 (L) 8.9 - 10.3 mg/dL   GFR calc non Af Amer >60 >60 mL/min   GFR calc Af Amer >60 >60 mL/min   Anion gap 8 5 - 15    [place] Abscess Drain  Result Date: 01/01/2017 INDICATION: Posttraumatic fluid collection along the lateral knee, possible abscess EXAM: ULTRASOUND-GUIDED ASPIRATION OF A SUBCUTANEOUS FLUID COLLECTION MEDICATIONS: NONE ANESTHESIA/SEDATION: NONE COMPLICATIONS: None immediate. PROCEDURE: Informed written consent was obtained from the patient after a thorough discussion of the procedural risks, benefits and alternatives.  All questions were addressed. Maximal Sterile Barrier Technique was utilized including caps, mask, sterile gowns, sterile gloves, sterile drape, hand hygiene and skin antiseptic. A timeout was performed prior to the initiation of the procedure. Utilizing 1% xylocaine as local anesthetic and real-time ultrasound guidance an 18 gauge spinal needle was placed percutaneously into the known subcutaneous fluid collection within the lateral aspect of the left knee. Aspiration was performed although no significant fluid was obtained. The needle was advanced throughout the collection in aspiration was performed an yielded only a few drops of old blood. These findings are consistent with a posttraumatic hematoma. No purulent material was withdrawn. IMPRESSION: Aspiration of the left knee fluid collection yields only a few drops of old blood consistent with post traumatic hematoma. This was sent  for culture. No findings to suggest abscess are noted. Electronically Signed   By: Alcide Clever M.D.   On: 01/01/2017 13:59   Ct Knee Left W Contrast  Result Date: 12/31/2016 CLINICAL DATA:  41 year old male with left knee pain and drainage. EXAM: CT OF THE left KNEE WITH CONTRAST TECHNIQUE: Multidetector CT imaging was performed following the standard protocol during bolus administration of intravenous contrast. COMPARISON:  Left knee radiograph dated 12/31/2016 and ultrasound dated 12/31/2016 FINDINGS: Bones/Joint/Cartilage There is no acute fracture or dislocation. The bones are well mineralized. An area of cortical irregularity in the distal femoral metaphysis noted which may be related to prior surgery or trauma. Ligaments Suboptimally assessed by CT. Muscles and Tendons No traumatic injury. Soft tissues There is diffuse edema and stranding of the subcutaneous soft tissues of the left knee. There is an ill-defined heterogeneous area in the soft tissues of the anterolateral distal femur proximally 7.5 cm above the knee which appears to extend to the skin. There is thickening of the overlying skin. This corresponds to the complex fluid collection seen on the earlier ultrasound and may represent an infected hematoma or a complex inflammatory collection with debris. Clinical correlation is recommended. IMPRESSION: Heterogeneous low attenuating area involving the soft tissues of the anterolateral aspect of the distal femur corresponding to the fluid collection seen on the earlier ultrasound. This likely represents a complex fluid with inflammatory debris. Clinical correlation is recommended. Electronically Signed   By: Elgie Collard M.D.   On: 12/31/2016 23:42   [place] Extrem Low Left Ltd  Result Date: 12/31/2016 CLINICAL DATA:  Worsening wounds to left anterior thigh with edema and pain EXAM: ULTRASOUND left LOWER EXTREMITY LIMITED TECHNIQUE: Ultrasound examination of the lower extremity soft tissues was  performed in the area of clinical concern. COMPARISON:  Radiograph 12/31/2016 FINDINGS: Diffuse soft tissue edema within the left lateral knee. Complex fluid collection within the left lateral knee soft tissues measuring 4.4 x 1.3 x 2.2 cm. This appears contiguous with a linear tract extending toward the skin surface. IMPRESSION: 4.4 cm complex fluid collection within the soft tissues of the left lateral knee, with probable sinus tract extending toward the skin surface at its inferior extent. Findings could be secondary to soft tissue abscess, or possibly infected hematoma. Electronically Signed   By: Jasmine Pang M.D.   On: 12/31/2016 21:50   Dg Knee Complete 4 Views Left  Result Date: 12/31/2016 CLINICAL DATA:  Brown drainage from wound at the left knee EXAM: LEFT KNEE - COMPLETE 4+ VIEW COMPARISON:  12/26/2016 FINDINGS: No fracture or malalignment. Joint space compartments are maintained. Soft tissue edema. No soft tissue gas. IMPRESSION: No acute osseous abnormality Electronically Signed   By: Selena Batten  Jake SamplesFujinaga M.D.   On: 12/31/2016 21:14    Assessment/Plan:     Principal Problem:   Cellulitis and abscess of left leg Active Problems:   Gout   GERD (gastroesophageal reflux disease)  I spoke with Dr. Sampson GoonFitzgerald this morning. He felt that the patient was clinically improved he can potentially be discharged on oral antibiotics today. He recommended Augmentin and doxycycline. He will stop by to see the patient is afternoon himself. Given the patient's clinical improvement I have canceled the I&D procedure that I had tentatively put on the OR schedule. Patient may follow-up in my office in 7-10 days upon discharge for wound check and suture removal. He may weight-bear as tolerated on the left lower extremity. I will defer to Dr. Sampson GoonFitzgerald regarding length of antibiotic treatment.  I explained this to the patient and his family. They understand and agree with the above plan.    Juanell FairlyKRASINSKI, Mardene Lessig ,  MD 01/02/2017, 12:28 PM

## 2017-01-02 NOTE — Progress Notes (Signed)
Pharmacy Antibiotic Note  Ethan George is a 41 y.o. male admitted on 12/31/2016 with wound infection.  Pharmacy has been consulted for ampicillin/sulbactam and vancomycin dosing.  Plan: Continue ampicillin/sulbactam 3 g IV q6h  Patient is on vancomycin 1000 mg IV q8h and had VT scheduled for this evening. Received call from RN that patient lost IV access resulting in dose being given late (~1730). Will retime VT for 0530 tomorrow morning.  Goal VT 15-20 mcg/mL  Height: 5\' 9"  (175.3 cm) Weight: 213 lb 3.2 oz (96.7 kg) IBW/kg (Calculated) : 70.7  Temp (24hrs), Avg:98 F (36.7 C), Min:97.8 F (36.6 C), Max:98.3 F (36.8 C)   Recent Labs Lab 12/31/16 2057 01/01/17 0010 01/01/17 0416 01/02/17 0516  WBC 8.5  --  9.4 5.9  CREATININE 0.99  --  0.95 0.96  LATICACIDVEN 1.9 1.1  --   --     Estimated Creatinine Clearance: 118.5 mL/min (by C-G formula based on SCr of 0.96 mg/dL).    Allergies  Allergen Reactions  . Ultram [Tramadol Hcl] Hives    Thank you for allowing pharmacy to be a part of this patient's care.  Cindi CarbonMary M Matelyn Antonelli, Pharm.D., BCPS Clinical Pharmacist 01/02/2017 5:44 PM

## 2017-01-02 NOTE — Progress Notes (Signed)
[employer] - [place] at [facility]                                                                                                                                                                                  Patient Demographics   Ethan George, is a 41 y.o. male, DOB - 09/01/1975, ZOX:[id number]  Admit date - 12/31/2016   Admitting Physician Oralia Manisavid Willis, MD  Outpatient Primary MD for the patient is Patient, No Pcp Per   LOS - 0  Subjective: Left leg swelling improved    Review of Systems:   CONSTITUTIONAL: No documented fever. No fatigue, weakness. No weight gain, no weight loss.  EYES: No blurry or double vision.  ENT: No tinnitus. No postnasal drip. No redness of the oropharynx.  RESPIRATORY: No cough, no wheeze, no hemoptysis. No dyspnea.  CARDIOVASCULAR: No chest pain. No orthopnea. No palpitations. No syncope.  GASTROINTESTINAL: No nausea, no vomiting or diarrhea. No abdominal pain. No melena or hematochezia.  GENITOURINARY: No dysuria or hematuria.  ENDOCRINE: No polyuria or nocturia. No heat or cold intolerance.  HEMATOLOGY: No anemia. No bruising. No bleeding.  INTEGUMENTARY: Positive swelling around left knee MUSCULOSKELETAL: No arthritis. No swelling. No gout.  NEUROLOGIC: No numbness, tingling, or ataxia. No seizure-type activity.  PSYCHIATRIC: No anxiety. No insomnia. No ADD.    Vitals:   Vitals:   01/01/17 1322 01/01/17 1344 01/01/17 1921 01/02/17 0800  BP: (!) 144/89 (!) 157/89 (!) 149/81 115/84  Pulse: 63 65 71 61  Resp: 18 18 18 18   Temp:   98.3 F (36.8 C) 98 F (36.7 C)  TempSrc:   Oral Oral  SpO2: 98% 96% 96% 97%  Weight:      Height:        Wt Readings from Last 3 Encounters:  12/31/16 213 lb 3.2 oz (96.7 kg)  12/26/16 210 lb (95.3 kg)  12/20/16 210 lb (95.3 kg)     Intake/Output Summary (Last 24 hours) at 01/02/17 1533 Last data filed at 01/02/17 1349  Gross per 24 hour  Intake              960 ml  Output                 0 ml  Net              960 ml    Physical Exam:   GENERAL: Pleasant-appearing in no apparent distress.  HEAD, EYES, EARS, NOSE AND THROAT: Atraumatic, normocephalic. Extraocular muscles are intact. Pupils equal and reactive to light. Sclerae anicteric. No conjunctival injection. No oro-pharyngeal erythema.  NECK: Supple. There is no jugular venous distention. No bruits, no  lymphadenopathy, no thyromegaly.  HEART: Regular rate and rhythm,. No murmurs, no rubs, no clicks.  LUNGS: Clear to auscultation bilaterally. No rales or rhonchi. No wheezes.  ABDOMEN: Soft, flat, nontender, nondistended. Has good bowel sounds. No hepatosplenomegaly appreciated.  EXTREMITIES: No evidence of any cyanosis, clubbing, or peripheral edema.  +2 pedal and radial pulses bilaterally.  NEUROLOGIC: The patient is alert, awake, and oriented x3 with no focal motor or sensory deficits appreciated bilaterally.  SKIN: His erythema and tenderness around the near his left knee Psych: Not anxious, depressed LN: No inguinal LN enlargement    Antibiotics   Anti-infectives    Start     Dose/Rate Route Frequency Ordered Stop   01/01/17 2200  vancomycin (VANCOCIN) IVPB 1000 mg/200 mL premix     1,000 mg 200 mL/hr over 60 Minutes Intravenous Every 8 hours 01/01/17 1425     01/01/17 1600  vancomycin (VANCOCIN) IVPB 1000 mg/200 mL premix     1,000 mg 200 mL/hr over 60 Minutes Intravenous  Once 01/01/17 1425 01/01/17 1628   01/01/17 1100  Ampicillin-Sulbactam (UNASYN) 3 g in sodium chloride 0.9 % 100 mL IVPB     3 g 200 mL/hr over 30 Minutes Intravenous Every 6 hours 01/01/17 1014     01/01/17 0600  clindamycin (CLEOCIN) IVPB 600 mg  Status:  Discontinued     600 mg 100 mL/hr over 30 Minutes Intravenous Every 8 hours 01/01/17 0025 01/01/17 1420   12/31/16 2230  clindamycin (CLEOCIN) IVPB 600 mg     600 mg 100 mL/hr over 30 Minutes Intravenous  Once 12/31/16 2223 12/31/16 2324   12/31/16 0000  clindamycin (CLEOCIN)  300 MG capsule  Status:  Discontinued     300 mg Oral 4 times daily 12/31/16 2226 12/31/16       Medications   Scheduled Meds: . famotidine  20 mg Oral BID   Continuous Infusions: . ampicillin-sulbactam (UNASYN) IV Stopped (01/02/17 1305)  . vancomycin 1,000 mg (01/02/17 1506)   PRN Meds:.acetaminophen **OR** acetaminophen, morphine injection, ondansetron **OR** ondansetron (ZOFRAN) IV, oxyCODONE   Data Review:   Micro Results Recent Results (from the past 240 hour(s))  Aerobic/Anaerobic Culture (surgical/deep wound)     Status: None (Preliminary result)   Collection Time: 01/01/17  1:40 PM  Result Value Ref Range Status   Specimen Description KNEE  Final   Special Requests Normal  Final   Gram Stain   Final    RARE WBC PRESENT,BOTH PMN AND MONONUCLEAR NO ORGANISMS SEEN    Culture   Final    NO GROWTH < 24 HOURS Performed at [facility], [address] [place], [place] [address]    Report Status PENDING  Incomplete    Radiology Reports [place] Abscess Drain  Result Date: 01/01/2017 INDICATION: Posttraumatic fluid collection along the lateral knee, possible abscess EXAM: ULTRASOUND-GUIDED ASPIRATION OF A SUBCUTANEOUS FLUID COLLECTION MEDICATIONS: NONE ANESTHESIA/SEDATION: NONE COMPLICATIONS: None immediate. PROCEDURE: Informed written consent was obtained from the patient after a thorough discussion of the procedural risks, benefits and alternatives. All questions were addressed. Maximal Sterile Barrier Technique was utilized including caps, mask, sterile gowns, sterile gloves, sterile drape, hand hygiene and skin antiseptic. A timeout was performed prior to the initiation of the procedure. Utilizing 1% xylocaine as local anesthetic and real-time ultrasound guidance an 18 gauge spinal needle was placed percutaneously into the known subcutaneous fluid collection within the lateral aspect of the left knee. Aspiration was performed although no significant fluid was obtained.  The  needle was advanced throughout the collection in aspiration was performed an yielded only a few drops of old blood. These findings are consistent with a posttraumatic hematoma. No purulent material was withdrawn. IMPRESSION: Aspiration of the left knee fluid collection yields only a few drops of old blood consistent with post traumatic hematoma. This was sent for culture. No findings to suggest abscess are noted. Electronically Signed   By: Alcide Clever M.D.   On: 01/01/2017 13:59   Ct Knee Left W Contrast  Result Date: 12/31/2016 CLINICAL DATA:  41 year old male with left knee pain and drainage. EXAM: CT OF THE left KNEE WITH CONTRAST TECHNIQUE: Multidetector CT imaging was performed following the standard protocol during bolus administration of intravenous contrast. COMPARISON:  Left knee radiograph dated 12/31/2016 and ultrasound dated 12/31/2016 FINDINGS: Bones/Joint/Cartilage There is no acute fracture or dislocation. The bones are well mineralized. An area of cortical irregularity in the distal femoral metaphysis noted which may be related to prior surgery or trauma. Ligaments Suboptimally assessed by CT. Muscles and Tendons No traumatic injury. Soft tissues There is diffuse edema and stranding of the subcutaneous soft tissues of the left knee. There is an ill-defined heterogeneous area in the soft tissues of the anterolateral distal femur proximally 7.5 cm above the knee which appears to extend to the skin. There is thickening of the overlying skin. This corresponds to the complex fluid collection seen on the earlier ultrasound and may represent an infected hematoma or a complex inflammatory collection with debris. Clinical correlation is recommended. IMPRESSION: Heterogeneous low attenuating area involving the soft tissues of the anterolateral aspect of the distal femur corresponding to the fluid collection seen on the earlier ultrasound. This likely represents a complex fluid with inflammatory  debris. Clinical correlation is recommended. Electronically Signed   By: Elgie Collard M.D.   On: 12/31/2016 23:42   [place] Extrem Low Left Ltd  Result Date: 12/31/2016 CLINICAL DATA:  Worsening wounds to left anterior thigh with edema and pain EXAM: ULTRASOUND left LOWER EXTREMITY LIMITED TECHNIQUE: Ultrasound examination of the lower extremity soft tissues was performed in the area of clinical concern. COMPARISON:  Radiograph 12/31/2016 FINDINGS: Diffuse soft tissue edema within the left lateral knee. Complex fluid collection within the left lateral knee soft tissues measuring 4.4 x 1.3 x 2.2 cm. This appears contiguous with a linear tract extending toward the skin surface. IMPRESSION: 4.4 cm complex fluid collection within the soft tissues of the left lateral knee, with probable sinus tract extending toward the skin surface at its inferior extent. Findings could be secondary to soft tissue abscess, or possibly infected hematoma. Electronically Signed   By: Jasmine Pang M.D.   On: 12/31/2016 21:50   Dg Knee Complete 4 Views Left  Result Date: 12/31/2016 CLINICAL DATA:  Brown drainage from wound at the left knee EXAM: LEFT KNEE - COMPLETE 4+ VIEW COMPARISON:  12/26/2016 FINDINGS: No fracture or malalignment. Joint space compartments are maintained. Soft tissue edema. No soft tissue gas. IMPRESSION: No acute osseous abnormality Electronically Signed   By: Jasmine Pang M.D.   On: 12/31/2016 21:14   Dg Knee Complete 4 Views Left  Result Date: 12/26/2016 CLINICAL DATA:  Drainage from left knee sutures EXAM: LEFT KNEE - COMPLETE 4+ VIEW COMPARISON:  None. FINDINGS: No evidence of fracture, dislocation, or joint effusion. No evidence of arthropathy or other focal bone abnormality. Soft tissues are unremarkable. IMPRESSION: No osseous abnormality or soft tissue emphysema. Electronically Signed   By: Chrisandra Netters.D.  On: 12/26/2016 22:56     CBC  Recent Labs Lab 12/31/16 2057 01/01/17 0416  01/02/17 0516  WBC 8.5 9.4 5.9  HGB 15.2 14.7 14.4  HCT 44.4 42.1 41.0  PLT 285 272 244  MCV 93.3 94.1 93.3  MCH 32.0 32.8 32.8  MCHC 34.3 34.9 35.1  RDW 13.2 13.2 13.4  LYMPHSABS 2.0  --   --   MONOABS 0.6  --   --   EOSABS 0.4  --   --   BASOSABS 0.1  --   --     Chemistries   Recent Labs Lab 12/31/16 2057 01/01/17 0416 01/02/17 0516  NA 136 134* 136  K 3.4* 4.2 4.0  CL 100* 103 103  CO2 22 23 25   GLUCOSE 145* 104* 131*  BUN 14 13 16   CREATININE 0.99 0.95 0.96  CALCIUM 9.7 9.0 8.8*  AST 40  --   --   ALT 60  --   --   ALKPHOS 69  --   --   BILITOT 0.6  --   --    ------------------------------------------------------------------------------------------------------------------ estimated creatinine clearance is 118.5 mL/min (by C-G formula based on SCr of 0.96 mg/dL). ------------------------------------------------------------------------------------------------------------------ No results for input(s): HGBA1C in the last 72 hours. ------------------------------------------------------------------------------------------------------------------ No results for input(s): CHOL, HDL, LDLCALC, TRIG, CHOLHDL, LDLDIRECT in the last 72 hours. ------------------------------------------------------------------------------------------------------------------ No results for input(s): TSH, T4TOTAL, T3FREE, THYROIDAB in the last 72 hours.  Invalid input(s): FREET3 ------------------------------------------------------------------------------------------------------------------ No results for input(s): VITAMINB12, FOLATE, FERRITIN, TIBC, IRON, RETICCTPCT in the last 72 hours.  Coagulation profile No results for input(s): INR, PROTIME in the last 168 hours.  No results for input(s): DDIMER in the last 72 hours.  Cardiac Enzymes No results for input(s): CKMB, TROPONINI, MYOGLOBIN in the last 168 hours.  Invalid input(s):  CK ------------------------------------------------------------------------------------------------------------------ Invalid input(s): POCBNP    Assessment & Plan  Patient is 41 year old with swelling  1. Cellulitis - IV antibiotics,  Appreciate orthopedic input Patient had the area drained only dry blood Continue IV antibiotics for 1 more day oral antibiotics tomorrow  2.  Gout - patient treats PRN, no evidence of flare currently  3.   GERD (gastroesophageal reflux disease) - continue home dose H2 Blocker      Code Status Orders        Start     Ordered   01/01/17 0026  Full code  Continuous     01/01/17 0025    Code Status History    Date Active Date Inactive Code Status Order ID Comments User Context   This patient has a current code status but no historical code status.           Consults ortopedics surgery  DVT Prophylaxis  scd's  Lab Results  Component Value Date   PLT 244 01/02/2017     Time Spent in minutes Greater than 50% of time spent in care coordination and counseling patient regarding the condition and plan of care.   Auburn Bilberry M.D on 01/02/2017 at 3:33 PM  Between 7am to 6pm - Pager - [phone]  After 6pm go to [email or web address] - password [email or web address] [facility]  [facility]   Office  [phone]

## 2017-01-02 NOTE — Progress Notes (Signed)
[facility] CLINIC INFECTIOUS DISEASE PROGRESS NOTE Date of Admission:  12/31/2016     ID: Ethan George is a 41 y.o. male with L leg infection  Principal Problem:   Cellulitis and abscess of left leg Active Problems:   Gout   GERD (gastroesophageal reflux disease)   Subjective: No fevers, less pain but difficulty bending knee still. Some diarrhea. Having some muscle cramps as well.   ROS  Eleven systems are reviewed and negative except per hpi  Medications:  Antibiotics Given (last 72 hours)    Date/Time Action Medication Dose Rate   12/31/16 2238 New Bag/Given   clindamycin (CLEOCIN) IVPB 600 mg 600 mg 100 mL/hr   01/01/17 0504 New Bag/Given   clindamycin (CLEOCIN) IVPB 600 mg 600 mg 100 mL/hr   01/01/17 1100 New Bag/Given   Ampicillin-Sulbactam (UNASYN) 3 g in sodium chloride 0.9 % 100 mL IVPB 3 g 200 mL/hr   01/01/17 1528 New Bag/Given   vancomycin (VANCOCIN) IVPB 1000 mg/200 mL premix 1,000 mg 200 mL/hr   01/01/17 1640 New Bag/Given   Ampicillin-Sulbactam (UNASYN) 3 g in sodium chloride 0.9 % 100 mL IVPB 3 g 200 mL/hr   01/01/17 2140 New Bag/Given   vancomycin (VANCOCIN) IVPB 1000 mg/200 mL premix 1,000 mg 200 mL/hr   01/01/17 2323 New Bag/Given   Ampicillin-Sulbactam (UNASYN) 3 g in sodium chloride 0.9 % 100 mL IVPB 3 g 200 mL/hr   01/02/17 0544 New Bag/Given   Ampicillin-Sulbactam (UNASYN) 3 g in sodium chloride 0.9 % 100 mL IVPB 3 g 200 mL/hr   01/02/17 9604 New Bag/Given   vancomycin (VANCOCIN) IVPB 1000 mg/200 mL premix 1,000 mg 200 mL/hr   01/02/17 1235 New Bag/Given   Ampicillin-Sulbactam (UNASYN) 3 g in sodium chloride 0.9 % 100 mL IVPB 3 g 200 mL/hr     . famotidine  20 mg Oral BID    Objective: Vital signs in last 24 hours: Temp:  [98 F (36.7 C)-98.3 F (36.8 C)] 98 F (36.7 C) (10/24 0800) Pulse Rate:  [61-71] 61 (10/24 0800) Resp:  [18] 18 (10/24 0800) BP: (115-157)/(81-89) 115/84 (10/24 0800) SpO2:  [96 %-97 %] 97 % (10/24  0800) Constitutional: He is oriented to person, place, and time. He appears well-developed and well-nourished. No distress.  HENT:  Mouth/Throat: Oropharynx is clear and moist. No oropharyngeal exudate.  Cardiovascular: Normal rate, regular rhythm and normal heart sounds. Exam reveals no gallop and no friction rub.  No murmur heard.  Pulmonary/Chest: Effort normal and breath sounds normal. No respiratory distress. He has no wheezes.  Abdominal: Soft. Bowel sounds are normal. He exhibits no distension. There is no tenderness.  Lymphadenopathy: He has no cervical adenopathy.  Neurological: He is alert and oriented to person, place, and time.  Skin: L leg with sutures above knee, with some dried blood, mod ttp Psychiatric: He has a normal mood and affect. His behavior is normal.   Lab Results  Recent Labs  01/01/17 0416 01/02/17 0516  WBC 9.4 5.9  HGB 14.7 14.4  HCT 42.1 41.0  NA 134* 136  K 4.2 4.0  CL 103 103  CO2 23 25  BUN 13 16  CREATININE 0.95 0.96    Microbiology: Results for orders placed or performed during the hospital encounter of 12/31/16  Aerobic/Anaerobic Culture (surgical/deep wound)     Status: None (Preliminary result)   Collection Time: 01/01/17  1:40 PM  Result Value Ref Range Status   Specimen Description KNEE  Final   Special  Requests Normal  Final   Gram Stain   Final    RARE WBC PRESENT,BOTH PMN AND MONONUCLEAR NO ORGANISMS SEEN Performed at [facility], [address] [place], [place] [address]    Culture PENDING  Incomplete   Report Status PENDING  Incomplete    Studies/Results: [place] Abscess Drain  Result Date: 01/01/2017 INDICATION: Posttraumatic fluid collection along the lateral knee, possible abscess EXAM: ULTRASOUND-GUIDED ASPIRATION OF A SUBCUTANEOUS FLUID COLLECTION MEDICATIONS: NONE ANESTHESIA/SEDATION: NONE COMPLICATIONS: None immediate. PROCEDURE: Informed written consent was obtained from the patient after a thorough discussion  of the procedural risks, benefits and alternatives. All questions were addressed. Maximal Sterile Barrier Technique was utilized including caps, mask, sterile gowns, sterile gloves, sterile drape, hand hygiene and skin antiseptic. A timeout was performed prior to the initiation of the procedure. Utilizing 1% xylocaine as local anesthetic and real-time ultrasound guidance an 18 gauge spinal needle was placed percutaneously into the known subcutaneous fluid collection within the lateral aspect of the left knee. Aspiration was performed although no significant fluid was obtained. The needle was advanced throughout the collection in aspiration was performed an yielded only a few drops of old blood. These findings are consistent with a posttraumatic hematoma. No purulent material was withdrawn. IMPRESSION: Aspiration of the left knee fluid collection yields only a few drops of old blood consistent with post traumatic hematoma. This was sent for culture. No findings to suggest abscess are noted. Electronically Signed   By: Alcide Clever M.D.   On: 01/01/2017 13:59   Ct Knee Left W Contrast  Result Date: 12/31/2016 CLINICAL DATA:  41 year old male with left knee pain and drainage. EXAM: CT OF THE left KNEE WITH CONTRAST TECHNIQUE: Multidetector CT imaging was performed following the standard protocol during bolus administration of intravenous contrast. COMPARISON:  Left knee radiograph dated 12/31/2016 and ultrasound dated 12/31/2016 FINDINGS: Bones/Joint/Cartilage There is no acute fracture or dislocation. The bones are well mineralized. An area of cortical irregularity in the distal femoral metaphysis noted which may be related to prior surgery or trauma. Ligaments Suboptimally assessed by CT. Muscles and Tendons No traumatic injury. Soft tissues There is diffuse edema and stranding of the subcutaneous soft tissues of the left knee. There is an ill-defined heterogeneous area in the soft tissues of the anterolateral  distal femur proximally 7.5 cm above the knee which appears to extend to the skin. There is thickening of the overlying skin. This corresponds to the complex fluid collection seen on the earlier ultrasound and may represent an infected hematoma or a complex inflammatory collection with debris. Clinical correlation is recommended. IMPRESSION: Heterogeneous low attenuating area involving the soft tissues of the anterolateral aspect of the distal femur corresponding to the fluid collection seen on the earlier ultrasound. This likely represents a complex fluid with inflammatory debris. Clinical correlation is recommended. Electronically Signed   By: Elgie Collard M.D.   On: 12/31/2016 23:42   [place] Extrem Low Left Ltd  Result Date: 12/31/2016 CLINICAL DATA:  Worsening wounds to left anterior thigh with edema and pain EXAM: ULTRASOUND left LOWER EXTREMITY LIMITED TECHNIQUE: Ultrasound examination of the lower extremity soft tissues was performed in the area of clinical concern. COMPARISON:  Radiograph 12/31/2016 FINDINGS: Diffuse soft tissue edema within the left lateral knee. Complex fluid collection within the left lateral knee soft tissues measuring 4.4 x 1.3 x 2.2 cm. This appears contiguous with a linear tract extending toward the skin surface. IMPRESSION: 4.4 cm complex fluid collection within the soft  tissues of the left lateral knee, with probable sinus tract extending toward the skin surface at its inferior extent. Findings could be secondary to soft tissue abscess, or possibly infected hematoma. Electronically Signed   By: Jasmine PangKim  Fujinaga M.D.   On: 12/31/2016 21:50   Dg Knee Complete 4 Views Left  Result Date: 12/31/2016 CLINICAL DATA:  Brown drainage from wound at the left knee EXAM: LEFT KNEE - COMPLETE 4+ VIEW COMPARISON:  12/26/2016 FINDINGS: No fracture or malalignment. Joint space compartments are maintained. Soft tissue edema. No soft tissue gas. IMPRESSION: No acute osseous abnormality  Electronically Signed   By: Jasmine PangKim  Fujinaga M.D.   On: 12/31/2016 21:14    Assessment/Plan: Ethan George is a 41 y.o. male with L leg soft tissue hematoma and infection after laceration several weeks ago. Failed otpt keflex therapy. Cx done from dried blood obtained on drainage and I cultured the wound 10/23.  Clinically improving but without culture data would rec continuing IV abx at least another day.  Recommendations Cont vanco and unasyn. Likely can transition to orals tomorrow if improving- would dc on augmentin 875 bid and doxy 100 bid for 7 days with fu with ortho and me if needed. Thank you very much for the consult. Will follow with you.  Mick SellDavid P Madalene Mickler   01/02/2017, 1:38 PM

## 2017-01-03 LAB — VANCOMYCIN, TROUGH: Vancomycin Tr: 13 ug/mL — ABNORMAL LOW (ref 15–20)

## 2017-01-03 MED ORDER — VANCOMYCIN HCL 10 G IV SOLR
1250.0000 mg | Freq: Three times a day (TID) | INTRAVENOUS | Status: DC
Start: 2017-01-03 — End: 2017-01-03
  Administered 2017-01-03: 1250 mg via INTRAVENOUS
  Filled 2017-01-03 (×2): qty 1250

## 2017-01-03 MED ORDER — OXYCODONE-ACETAMINOPHEN 5-325 MG PO TABS: 1.0000 | ORAL_TABLET | Freq: Four times a day (QID) | ORAL | 0 refills | Status: DC | PRN

## 2017-01-03 MED ORDER — DOXYCYCLINE HYCLATE 100 MG PO CAPS: 100.0000 mg | ORAL_CAPSULE | Freq: Two times a day (BID) | ORAL | 0 refills | Status: AC

## 2017-01-03 MED ORDER — AMOXICILLIN-POT CLAVULANATE 875-125 MG PO TABS: 1.0000 | ORAL_TABLET | Freq: Two times a day (BID) | ORAL | 0 refills | Status: AC

## 2017-01-03 NOTE — Discharge Summary (Signed)
[facility] - [place] at [facility]  Ethan George, [place]41 y.o., DOB [birth year], MRN [id number]. Admission date: 12/31/2016 Discharge Date 01/03/2017 Primary MD Patient, No Pcp Per Admitting Physician Ethan Manisavid Willis, MD  Admission Diagnosis  Abscess of left knee [L02.416] Cellulitis of left lower extremity [L03.116] Visit for wound check [Z51.89]  Discharge Diagnosis   Principal Problem:   Cellulitis and hematoma of the left leg   Gout   GERD (gastroesophageal reflux disease)            Hospital Course  Patient is 41 year old male who had sustained injury to his left knee and developed a wound after falling. Patient subsequently had sutures placed. 10/17 had wound check and noticed increased drainage and pain around site. Given keflex.  Seen again 10/22 with increasing pain and erythema and drainage.  WBC nml, no fevers. He was seen in the emergency room for these and then admitted CT scan showed possible fluid collection. That area. Therefore he was admitted. He underwent drainage by interventional radiology with some bloody drainage. There was no bacteria noted. He was seen in consultation by infectious disease who recommended oral antibiotics. He'll follow up outpatient with orthopedics for suture removal for and ID needed.           Consults  orthopedic surgery, infectious disease   Significant Tests:  See full reports for all details     [place] Abscess Drain  Result Date: 01/01/2017 INDICATION: Posttraumatic fluid collection along the lateral knee, possible abscess EXAM: ULTRASOUND-GUIDED ASPIRATION OF A SUBCUTANEOUS FLUID COLLECTION MEDICATIONS: NONE ANESTHESIA/SEDATION: NONE COMPLICATIONS: None immediate. PROCEDURE: Informed written consent was obtained from the patient after a thorough discussion of the procedural risks, benefits and alternatives. All questions were addressed. Maximal Sterile Barrier Technique was utilized including caps, mask, sterile  gowns, sterile gloves, sterile drape, hand hygiene and skin antiseptic. A timeout was performed prior to the initiation of the procedure. Utilizing 1% xylocaine as local anesthetic and real-time ultrasound guidance an 18 gauge spinal needle was placed percutaneously into the known subcutaneous fluid collection within the lateral aspect of the left knee. Aspiration was performed although no significant fluid was obtained. The needle was advanced throughout the collection in aspiration was performed an yielded only a few drops of old blood. These findings are consistent with a posttraumatic hematoma. No purulent material was withdrawn. IMPRESSION: Aspiration of the left knee fluid collection yields only a few drops of old blood consistent with post traumatic hematoma. This was sent for culture. No findings to suggest abscess are noted. Electronically Signed   By: Alcide CleverMark  Lukens M.D.   On: 01/01/2017 13:59   Ct Knee Left W Contrast  Result Date: 12/31/2016 CLINICAL DATA:  [age] year old male with left knee pain and drainage. EXAM: CT OF THE left KNEE WITH CONTRAST TECHNIQUE: Multidetector CT imaging was performed following the standard protocol during bolus administration of intravenous contrast. COMPARISON:  Left knee radiograph dated 12/31/2016 and ultrasound dated 12/31/2016 FINDINGS: Bones/Joint/Cartilage There is no acute fracture or dislocation. The bones are well mineralized. An area of cortical irregularity in the distal femoral metaphysis noted which may be related to prior surgery or trauma. Ligaments Suboptimally assessed by CT. Muscles and Tendons No traumatic injury. Soft tissues There is diffuse edema and stranding of the subcutaneous soft tissues of the left knee. There is an ill-defined heterogeneous area in the soft tissues of the anterolateral distal femur proximally 7.5 cm above the knee which appears to extend to the skin. There is  thickening of the overlying skin. This corresponds to the complex  fluid collection seen on the earlier ultrasound and may represent an infected hematoma or a complex inflammatory collection with debris. Clinical correlation is recommended. IMPRESSION: Heterogeneous low attenuating area involving the soft tissues of the anterolateral aspect of the distal femur corresponding to the fluid collection seen on the earlier ultrasound. This likely represents a complex fluid with inflammatory debris. Clinical correlation is recommended. Electronically Signed   By: Elgie Collard M.D.   On: 12/31/2016 23:42   [place] Extrem Low Left Ltd  Result Date: 12/31/2016 CLINICAL DATA:  Worsening wounds to left anterior thigh with edema and pain EXAM: ULTRASOUND left LOWER EXTREMITY LIMITED TECHNIQUE: Ultrasound examination of the lower extremity soft tissues was performed in the area of clinical concern. COMPARISON:  Radiograph 12/31/2016 FINDINGS: Diffuse soft tissue edema within the left lateral knee. Complex fluid collection within the left lateral knee soft tissues measuring 4.4 x 1.3 x 2.2 cm. This appears contiguous with a linear tract extending toward the skin surface. IMPRESSION: 4.4 cm complex fluid collection within the soft tissues of the left lateral knee, with probable sinus tract extending toward the skin surface at its inferior extent. Findings could be secondary to soft tissue abscess, or possibly infected hematoma. Electronically Signed   By: Jasmine Pang M.D.   On: 12/31/2016 21:50   Dg Knee Complete 4 Views Left  Result Date: 12/31/2016 CLINICAL DATA:  Brown drainage from wound at the left knee EXAM: LEFT KNEE - COMPLETE 4+ VIEW COMPARISON:  12/26/2016 FINDINGS: No fracture or malalignment. Joint space compartments are maintained. Soft tissue edema. No soft tissue gas. IMPRESSION: No acute osseous abnormality Electronically Signed   By: Jasmine Pang M.D.   On: 12/31/2016 21:14   Dg Knee Complete 4 Views Left  Result Date: 12/26/2016 CLINICAL DATA:  Drainage from left  knee sutures EXAM: LEFT KNEE - COMPLETE 4+ VIEW COMPARISON:  None. FINDINGS: No evidence of fracture, dislocation, or joint effusion. No evidence of arthropathy or other focal bone abnormality. Soft tissues are unremarkable. IMPRESSION: No osseous abnormality or soft tissue emphysema. Electronically Signed   By: Deatra Robinson M.D.   On: 12/26/2016 22:56       Today   Subjective:   Ethan George  patient feeling better pain much less  Objective:   Blood pressure 139/85, pulse 62, temperature 97.7 F (36.5 C), temperature source Oral, resp. rate 18, height 5\' 9"  (1.753 m), weight 213 lb 3.2 oz (96.7 kg), SpO2 98 %.  .  Intake/Output Summary (Last 24 hours) at 01/03/17 1506 Last data filed at 01/03/17 1023  Gross per 24 hour  Intake              240 ml  Output              200 ml  Net               40 ml    Exam VITAL SIGNS: Blood pressure 139/85, pulse 62, temperature 97.7 F (36.5 C), temperature source Oral, resp. rate 18, height 5\' 9"  (1.753 m), weight 213 lb 3.2 oz (96.7 kg), SpO2 98 %.  GENERAL:  41 y.o.-year-old patient lying in the bed with no acute distress.  EYES: Pupils equal, round, reactive to light and accommodation. No scleral icterus. Extraocular muscles intact.  HEENT: Head atraumatic, normocephalic. Oropharynx and nasopharynx clear.  NECK:  Supple, no jugular venous distention. No thyroid enlargement, no tenderness.  LUNGS: Normal breath  sounds bilaterally, no wheezing, rales,rhonchi or crepitation. No use of accessory muscles of respiration.  CARDIOVASCULAR: S1, S2 normal. No murmurs, rubs, or gallops.  ABDOMEN: Soft, nontender, nondistended. Bowel sounds present. No organomegaly or mass.  EXTREMITIES: Left leg area not much swelling suture in place near the knee   NEUROLOGIC: Cranial nerves II through XII are intact. Muscle strength 5/5 in all extremities. Sensation intact. Gait not checked.  PSYCHIATRIC: The patient is alert and oriented x 3.  SKIN: No obvious  rash, lesion, or ulcer.   Data Review     CBC w Diff: Lab Results  Component Value Date   WBC 5.9 01/02/2017   HGB 14.4 01/02/2017   HGB 13.7 01/25/2015   HCT 41.0 01/02/2017   HCT 41.0 01/25/2015   PLT 244 01/02/2017   PLT 284 01/25/2015   LYMPHOPCT 24 12/31/2016   LYMPHOPCT 17.5 03/20/2014   MONOPCT 7 12/31/2016   MONOPCT 6.0 03/20/2014   EOSPCT 5 12/31/2016   EOSPCT 4.5 03/20/2014   BASOPCT 1 12/31/2016   BASOPCT 1.0 03/20/2014   CMP: Lab Results  Component Value Date   NA 136 01/02/2017   NA 139 01/25/2015   K 4.0 01/02/2017   CL 103 01/02/2017   CO2 25 01/02/2017   BUN 16 01/02/2017   BUN 8 01/25/2015   CREATININE 0.96 01/02/2017   PROT 8.2 (H) 12/31/2016   PROT 6.9 01/25/2015   ALBUMIN 4.9 12/31/2016   ALBUMIN 4.6 01/25/2015   BILITOT 0.6 12/31/2016   BILITOT 0.4 01/25/2015   ALKPHOS 69 12/31/2016   AST 40 12/31/2016   ALT 60 12/31/2016  .  Micro Results Recent Results (from the past 240 hour(s))  Aerobic/Anaerobic Culture (surgical/deep wound)     Status: None (Preliminary result)   Collection Time: 01/01/17  1:40 PM  Result Value Ref Range Status   Specimen Description KNEE  Final   Special Requests Normal  Final   Gram Stain   Final    RARE WBC PRESENT,BOTH PMN AND MONONUCLEAR NO ORGANISMS SEEN    Culture   Final    NO GROWTH 2 DAYS NO ANAEROBES ISOLATED; CULTURE IN PROGRESS FOR 5 DAYS Performed at [facility], [address], [place], [place] [address]    Report Status PENDING  Incomplete  Culture, fungus without smear     Status: None (Preliminary result)   Collection Time: 01/01/17  1:40 PM  Result Value Ref Range Status   Specimen Description JOINT FLUID  Final   Special Requests left lateral knee  Final   Culture   Final    NO FUNGUS ISOLATED AFTER 2 DAYS Performed at [facility], [address], [place], [place] [address]    Report Status PENDING  Incomplete        Code Status Orders        Start     Ordered    01/01/17 0026  Full code  Continuous     01/01/17 0025    Code Status History    Date Active Date Inactive Code Status Order ID Comments User Context   This patient has a current code status but no historical code status.          Follow-up Information    Juanell Fairly, MD. Schedule an appointment as soon as possible for a visit on 01/14/2017.   Specialty:  Orthopedic Surgery Why:  7-10 days for follow-up;  @ 3:30 pm Contact information: [address] [place] [address]  [phone]           Discharge Medications   Allergies as of 01/03/2017      Reactions   Ultram [tramadol Hcl] Hives      Medication List    STOP taking these medications   cephALEXin 500 MG capsule Commonly known as:  KEFLEX   Hydrocodone-Acetaminophen 2.5-325 MG Tabs     TAKE these medications   amoxicillin-clavulanate 875-125 MG tablet Commonly known as:  AUGMENTIN Take 1 tablet by mouth 2 (two) times daily.   cyclobenzaprine 5 MG tablet Commonly known as:  FLEXERIL Take 1 tablet (5 mg total) by mouth 3 (three) times daily as needed for muscle spasms.   doxycycline 100 MG capsule Commonly known as:  VIBRAMYCIN Take 1 capsule (100 mg total) by mouth 2 (two) times daily.   ibuprofen 600 MG tablet Commonly known as:  ADVIL,MOTRIN Take 1 tablet (600 mg total) by mouth every 8 (eight) hours as needed.   omeprazole 20 MG capsule Commonly known as:  PRILOSEC Take 20 mg by mouth daily.   oxyCODONE-acetaminophen 5-325 MG tablet Commonly known as:  ROXICET Take 1 tablet by mouth every 6 (six) hours as needed.   ranitidine 150 MG tablet Commonly known as:  ZANTAC Take 1 tablet (150 mg total) by mouth 2 (two) times daily.          Total Time in preparing paper work, data evaluation and todays exam - 35 minutes  Auburn Bilberry M.D on 01/03/2017 at 3:06 [facility]  [facility]   Office  [phone]

## 2017-01-03 NOTE — Progress Notes (Signed)
Patient is being discharged home. Script given to patient. Reviewed meds and last dose given. Waiting for IV ABX to finish. Reviewed follow-up appointment. Allowed time for questions.

## 2017-01-03 NOTE — Discharge Instructions (Signed)
[facility] - [place] at [facility] ° °DIET:  °Regular diet ° °DISCHARGE CONDITION:  °Stable ° °ACTIVITY:  °Activity as tolerated ° °OXYGEN:  °Home Oxygen: No. °  °Oxygen Delivery: room air ° °DISCHARGE LOCATION:  °home  ° ° °ADDITIONAL DISCHARGE INSTRUCTION: ° ° °If you experience worsening of your admission symptoms, develop shortness of breath, life threatening emergency, suicidal or homicidal thoughts you must seek medical attention immediately by calling 911 or calling your MD immediately  if symptoms less severe. ° °You Must read complete instructions/literature along with all the possible adverse reactions/side effects for all the Medicines you take and that have been prescribed to you. Take any new Medicines after you have completely understood and accpet all the possible adverse reactions/side effects.  ° °Please note ° °You were cared for by a hospitalist during your hospital stay. If you have any questions about your discharge medications or the care you received while you were in the hospital after you are discharged, you can call the unit and asked to speak with the hospitalist on call if the hospitalist that took care of you is not available. Once you are discharged, your primary care physician will handle any further medical issues. Please note that NO REFILLS for any discharge medications will be authorized once you are discharged, as it is imperative that you return to your primary care physician (or establish a relationship with a primary care physician if you do not have one) for your aftercare needs so that they can reassess your need for medications and monitor your lab values. ° ° °

## 2017-01-03 NOTE — Progress Notes (Signed)
[facility] - [place] at [facility]    Ethan George was admitted to the Hospital on 12/31/2016 and Discharged  01/03/2017 and should be excused from work/school   for 7  days starting 12/31/2016 , may return to work/school without any restrictions.  Call Auburn BilberryShreyang Myrene Bougher MD with questions.  Auburn BilberryPATEL, AmeLie Hollars M.D on 01/03/2017,at 9:40 AM  [facility] [place] at [facility]    Office  [phone]

## 2017-01-03 NOTE — Progress Notes (Signed)
Pharmacy Antibiotic Note  Tomasita CrumbleRodney William Rew is a 41 y.o. male admitted on 12/31/2016 with wound infection.  Pharmacy has been consulted for ampicillin/sulbactam and vancomycin dosing.  Plan: Continue ampicillin/sulbactam 3 g IV q6h  Patient is on vancomycin 1000 mg IV q8h and had VT scheduled for this evening. Received call from RN that patient lost IV access resulting in dose being given late (~1730). Will retime VT for 0530 tomorrow morning.  10/25 @ 0526 VT 13 subtherapeutic from goal 15 - 20 mcg/mL. Will increase dose to vanc 1.25g IV q8h for an estimated trough of 16.3 mcg/mL. Will redraw vanc trough 10/26 @ 1300 prior to 4th dose. Renal function stable.  Goal VT 15-20 mcg/mL  Height: 5\' 9"  (175.3 cm) Weight: 213 lb 3.2 oz (96.7 kg) IBW/kg (Calculated) : 70.7  Temp (24hrs), Avg:97.8 F (36.6 C), Min:97.7 F (36.5 C), Max:98 F (36.7 C)   Recent Labs Lab 12/31/16 2057 01/01/17 0010 01/01/17 0416 01/02/17 0516 01/03/17 0526  WBC 8.5  --  9.4 5.9  --   CREATININE 0.99  --  0.95 0.96  --   LATICACIDVEN 1.9 1.1  --   --   --   VANCOTROUGH  --   --   --   --  13*    Estimated Creatinine Clearance: 118.5 mL/min (by C-G formula based on SCr of 0.96 mg/dL).    Allergies  Allergen Reactions  . Ultram [Tramadol Hcl] Hives    Thank you for allowing pharmacy to be a part of this patient's care.  Thomasene Rippleavid  Cassian Torelli, Pharm.D., BCPS Clinical Pharmacist 01/03/2017 6:15 AM

## 2017-01-03 NOTE — Progress Notes (Signed)
  Subjective:  Patient continues to improve.  No fevers.  Patient reports pain as mild.  Wife at the bedside.  Objective:   VITALS:   Vitals:   01/02/17 1628 01/02/17 1958 01/03/17 0427 01/03/17 0814  BP: (!) 159/83 (!) 152/89 117/76 139/85  Pulse: 73 80 67 62  Resp: 20 18 18 18   Temp: 97.8 F (36.6 C) 97.8 F (36.6 C) 97.7 F (36.5 C) 97.7 F (36.5 C)  TempSrc: Oral Oral Oral Oral  SpO2: 96% 98% 98% 98%  Weight:      Height:        PHYSICAL EXAM: Left lower extremity: Neurovascular intact Sensation intact distally Intact pulses distally Dorsiflexion/Plantar flexion intact Incision: scant drainage No cellulitis present Compartment soft  LABS  Results for orders placed or performed during the hospital encounter of 12/31/16 (from the past 24 hour(s))  Vancomycin, trough     Status: Abnormal   Collection Time: 01/03/17  5:26 AM  Result Value Ref Range   Vancomycin Tr 13 (L) 15 - 20 ug/mL    [place] Abscess Drain  Result Date: 01/01/2017 INDICATION: Posttraumatic fluid collection along the lateral knee, possible abscess EXAM: ULTRASOUND-GUIDED ASPIRATION OF A SUBCUTANEOUS FLUID COLLECTION MEDICATIONS: NONE ANESTHESIA/SEDATION: NONE COMPLICATIONS: None immediate. PROCEDURE: Informed written consent was obtained from the patient after a thorough discussion of the procedural risks, benefits and alternatives. All questions were addressed. Maximal Sterile Barrier Technique was utilized including caps, mask, sterile gowns, sterile gloves, sterile drape, hand hygiene and skin antiseptic. A timeout was performed prior to the initiation of the procedure. Utilizing 1% xylocaine as local anesthetic and real-time ultrasound guidance an 18 gauge spinal needle was placed percutaneously into the known subcutaneous fluid collection within the lateral aspect of the left knee. Aspiration was performed although no significant fluid was obtained. The needle was advanced throughout the collection in  aspiration was performed an yielded only a few drops of old blood. These findings are consistent with a posttraumatic hematoma. No purulent material was withdrawn. IMPRESSION: Aspiration of the left knee fluid collection yields only a few drops of old blood consistent with post traumatic hematoma. This was sent for culture. No findings to suggest abscess are noted. Electronically Signed   By: Alcide CleverMark  Lukens M.D.   On: 01/01/2017 13:59    Assessment/Plan:     Principal Problem:   Cellulitis and abscess of left leg Active Problems:   Gout   GERD (gastroesophageal reflux disease)  Continue antibiotics as prescribed by Dr. Sampson GoonFitzgerald.  Follow up in my office in 7-10 days.    Juanell FairlyKRASINSKI, Kevaughn Ewing , MD 01/03/2017, 12:02 PM

## 2017-01-06 LAB — AEROBIC/ANAEROBIC CULTURE (SURGICAL/DEEP WOUND): Special Requests: NORMAL

## 2017-01-06 LAB — AEROBIC/ANAEROBIC CULTURE W GRAM STAIN (SURGICAL/DEEP WOUND): Culture: NO GROWTH

## 2017-01-23 LAB — CULTURE, FUNGUS WITHOUT SMEAR

## 2017-01-24 DIAGNOSIS — S86919A Strain of unspecified muscle(s) and tendon(s) at lower leg level, unspecified leg, initial encounter: Secondary | ICD-10-CM | POA: Insufficient documentation

## 2017-02-13 ENCOUNTER — Other Ambulatory Visit: Payer: Self-pay | Admitting: Orthopedic Surgery

## 2017-02-13 DIAGNOSIS — M1712 Unilateral primary osteoarthritis, left knee: Secondary | ICD-10-CM

## 2017-02-20 ENCOUNTER — Ambulatory Visit (HOSPITAL_COMMUNITY): Payer: Managed Care, Other (non HMO)

## 2017-02-20 ENCOUNTER — Ambulatory Visit: Payer: Managed Care, Other (non HMO)

## 2017-02-27 ENCOUNTER — Ambulatory Visit
Admission: RE | Admit: 2017-02-27 | Discharge: 2017-02-27 | Disposition: A | Discharge status: Home / Self Care | Payer: Managed Care, Other (non HMO) | Source: Ambulatory Visit | Attending: Orthopedic Surgery | Admitting: Orthopedic Surgery

## 2017-02-27 DIAGNOSIS — L905 Scar conditions and fibrosis of skin: Secondary | ICD-10-CM | POA: Insufficient documentation

## 2017-02-27 DIAGNOSIS — S72432A Displaced fracture of medial condyle of left femur, initial encounter for closed fracture: Secondary | ICD-10-CM | POA: Insufficient documentation

## 2017-02-27 DIAGNOSIS — M1712 Unilateral primary osteoarthritis, left knee: Secondary | ICD-10-CM | POA: Insufficient documentation

## 2017-02-27 DIAGNOSIS — X58XXXA Exposure to other specified factors, initial encounter: Secondary | ICD-10-CM | POA: Diagnosis not present

## 2017-07-28 ENCOUNTER — Other Ambulatory Visit: Payer: Self-pay

## 2017-07-28 ENCOUNTER — Emergency Department
Admission: EM | Admit: 2017-07-28 | Discharge: 2017-07-28 | Disposition: A | Discharge status: Home / Self Care | Payer: Managed Care, Other (non HMO) | Attending: Emergency Medicine | Admitting: Emergency Medicine

## 2017-07-28 ENCOUNTER — Emergency Department: Payer: Managed Care, Other (non HMO)

## 2017-07-28 DIAGNOSIS — W010XXA Fall on same level from slipping, tripping and stumbling without subsequent striking against object, initial encounter: Secondary | ICD-10-CM | POA: Diagnosis not present

## 2017-07-28 DIAGNOSIS — Y93H9 Activity, other involving exterior property and land maintenance, building and construction: Secondary | ICD-10-CM | POA: Diagnosis not present

## 2017-07-28 DIAGNOSIS — Z79899 Other long term (current) drug therapy: Secondary | ICD-10-CM | POA: Insufficient documentation

## 2017-07-28 DIAGNOSIS — Y998 Other external cause status: Secondary | ICD-10-CM | POA: Diagnosis not present

## 2017-07-28 DIAGNOSIS — Y929 Unspecified place or not applicable: Secondary | ICD-10-CM | POA: Diagnosis not present

## 2017-07-28 DIAGNOSIS — F17210 Nicotine dependence, cigarettes, uncomplicated: Secondary | ICD-10-CM | POA: Insufficient documentation

## 2017-07-28 DIAGNOSIS — M25522 Pain in left elbow: Secondary | ICD-10-CM | POA: Diagnosis present

## 2017-07-28 DIAGNOSIS — M70832 Other soft tissue disorders related to use, overuse and pressure, left forearm: Secondary | ICD-10-CM | POA: Insufficient documentation

## 2017-07-28 DIAGNOSIS — M779 Enthesopathy, unspecified: Secondary | ICD-10-CM

## 2017-07-28 MED ORDER — IBUPROFEN 800 MG PO TABS: 800.0000 mg | ORAL_TABLET | Freq: Three times a day (TID) | ORAL | 0 refills | Status: DC | PRN

## 2017-07-28 MED ORDER — OXYCODONE-ACETAMINOPHEN 5-325 MG PO TABS
1.0000 | ORAL_TABLET | Freq: Once | ORAL | Status: AC
  Administered 2017-07-28: 1 via ORAL
  Filled 2017-07-28: qty 1

## 2017-07-28 NOTE — ED Triage Notes (Signed)
He arrives today ambulatory to triage with reports of falling on wet concrete yesterday injuring his left arm   7/10 pain reported

## 2017-07-28 NOTE — ED Provider Notes (Signed)
[facility] Emergency Department Provider Note  ____________________________________________  Time seen: Approximately 2:26 PM  I have reviewed the triage vital signs and the nursing notes.   HISTORY  Chief Complaint Arm Pain    HPI Ethan George is a 42 y.o. male presents emergency department for evaluation of left elbow pain after injury.  Patient was trying to get off of his lawnmower yesterday when he tripped on and landed an outstretched hand. He hyperextended his elbow. He heard a pop.  He has had pain over his elbow since.  He can move arm but with pain. No numbness, tingling.   Past Medical History:  Diagnosis Date  . GERD (gastroesophageal reflux disease)   . Gout     Patient Active Problem List   Diagnosis Date Noted  . Cellulitis and abscess of left leg 12/31/2016  . GERD (gastroesophageal reflux disease) 12/31/2016  . Gout   . 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 Current tobacco use 10/01/2014    Past Surgical History:  Procedure Laterality Date  . BACK SURGERY  2012,2016   X 2    Prior to Admission medications   Medication Sig Start Date End Date Taking? Authorizing Provider  cyclobenzaprine (FLEXERIL) 5 MG tablet Take 1 tablet (5 mg total) by mouth 3 (three) times daily as needed for muscle spasms. 12/20/16   Menshew, Charlesetta Ivory, PA-C  ibuprofen (ADVIL,MOTRIN) 800 MG tablet Take 1 tablet (800 mg total) by mouth every 8 (eight) hours as needed. 07/28/17   Enid Derry, PA-C  omeprazole (PRILOSEC) 20 MG capsule Take 20 mg by mouth daily.    [provider]  oxyCODONE-acetaminophen (ROXICET) 5-325 MG tablet Take 1 tablet by mouth every 6 (six) hours as needed. 01/03/17 01/03/18  Auburn Bilberry, MD  ranitidine (ZANTAC) 150 MG tablet Take 1 tablet (150 mg total) by mouth 2 (two) times daily. 12/03/15 01/02/16  Hagler, Jami L, PA-C    Allergies Ultram [tramadol hcl]  Family History  Problem  Relation Age of Onset  . Heart attack Maternal Grandfather     Social History Social History   Tobacco Use  . Smoking status: Current Every Day Smoker    Packs/day: 1.50    Years: 20.00    Pack years: 30.00    Types: Cigarettes  . Smokeless tobacco: Never Used  Substance Use Topics  . Alcohol use: No    Alcohol/week: 0.0 oz  . Drug use: No     Review of Systems  Constitutional: No fever/chills Respiratory: No SOB. Gastrointestinal: No abdominal pain.  No nausea, no vomiting.  Musculoskeletal: Positive for elbow pain.  Skin: Negative for rash, abrasions, lacerations, ecchymosis. Neurological: Negative for numbness or tingling   ____________________________________________   PHYSICAL EXAM:  VITAL SIGNS: ED Triage Vitals  Enc Vitals Group     BP 07/28/17 1400 (!) 150/83     Pulse Rate 07/28/17 1400 73     Resp 07/28/17 1400 16     Temp 07/28/17 1400 97.7 F (36.5 C)     Temp Source 07/28/17 1400 Oral     SpO2 07/28/17 1400 98 %     Weight 07/28/17 1401 217 lb (98.4 kg)     Height 07/28/17 1401  (1.753 m)     Head Circumference --      Peak Flow --      Pain Score 07/28/17 1400 7     Pain Loc --      Pain Edu? --  Excl. in GC? --      Constitutional: Alert and oriented. Well appearing and in no acute distress. Eyes: Conjunctivae are normal. PERRL. EOMI. Head: Atraumatic. ENT:      Ears:      Nose: No congestion/rhinnorhea.      Mouth/Throat: Mucous membranes are moist.  Neck: No stridor.   Cardiovascular: Normal rate, regular rhythm.  Good peripheral circulation.  Symmetric radial pulses bilaterally. Respiratory: Normal respiratory effort without tachypnea or retractions. Lungs CTAB. Good air entry to the bases with no decreased or absent breath sounds. Musculoskeletal: Full range of motion to all extremities. No gross deformities appreciated. Pain with ROM of left elbow.  Able to move elbow but with pain.  Mild swelling to proximal forearm.  Tenderness over extensor carpi ulnaris and brachioradialis. No tenderness over biceps or triceps. Neurologic:  Normal speech and language. No gross focal neurologic deficits are appreciated.  Skin:  Skin is warm, dry and intact. No rash noted. Psychiatric: Mood and affect are normal. Speech and behavior are normal. Patient exhibits appropriate insight and judgement.   ____________________________________________   LABS (all labs ordered are listed, but only abnormal results are displayed)  Labs Reviewed - No data to display ____________________________________________  EKG   ____________________________________________  RADIOLOGY Lexine Baton, personally viewed and evaluated these images (plain radiographs) as part of my medical decision making, as well as reviewing the written report by the radiologist.  Dg Elbow Complete Left  Result Date: 07/28/2017 CLINICAL DATA:  Hyperextension left elbow injury yesterday from fall. EXAM: LEFT ELBOW - COMPLETE 3+ VIEW COMPARISON:  None. FINDINGS: There is no evidence of fracture, dislocation, or joint effusion. There is no evidence of arthropathy or other focal bone abnormality. Soft tissues are unremarkable. IMPRESSION: Negative. Electronically Signed   By: Elberta Fortis M.D.   On: 07/28/2017 15:16    ____________________________________________    PROCEDURES  Procedure(s) performed:    Procedures    Medications  oxyCODONE-acetaminophen (PERCOCET/ROXICET) 5-325 MG per tablet 1 tablet (1 tablet Oral Given 07/28/17 1434)     ____________________________________________   INITIAL IMPRESSION / ASSESSMENT AND PLAN / ED COURSE  Pertinent labs & imaging results that were available during my care of the patient were reviewed by me and considered in my medical decision making (see chart for details).  Review of the [place] CSRS was performed in accordance of the [facility] prior to dispensing any controlled drugs.     Patient's diagnosis  is consistent with tendinitis.  Vital signs and exam are reassuring.  X-ray negative for acute bony abnormality's. Sling was placed.  Patient will be discharged home with prescriptions for ibuprofen. Patient is to follow up with PCP and orthopedics as directed. Patient is given ED precautions to return to the ED for any worsening or new symptoms.     ____________________________________________  FINAL CLINICAL IMPRESSION(S) / ED DIAGNOSES  Final diagnoses:  Tendinitis      NEW MEDICATIONS STARTED DURING THIS VISIT:  ED Discharge Orders        Ordered    ibuprofen (ADVIL,MOTRIN) 800 MG tablet  Every 8 hours PRN     07/28/17 1544          This chart was dictated using voice recognition software/Dragon. Despite best efforts to proofread, errors can occur which can change the meaning. Any change was purely unintentional.    Enid Derry, PA-C 07/28/17 1645    Sharman Cheek, MD 08/06/17 [phone]

## 2017-10-11 ENCOUNTER — Other Ambulatory Visit: Payer: Self-pay

## 2017-10-11 ENCOUNTER — Encounter: Payer: Self-pay | Admitting: Emergency Medicine

## 2017-10-11 ENCOUNTER — Ambulatory Visit
Admission: EM | Admit: 2017-10-11 | Discharge: 2017-10-11 | Disposition: A | Discharge status: Home / Self Care | Payer: Managed Care, Other (non HMO) | Attending: Family Medicine | Admitting: Family Medicine

## 2017-10-11 DIAGNOSIS — M10071 Idiopathic gout, right ankle and foot: Secondary | ICD-10-CM

## 2017-10-11 MED ORDER — HYDROCODONE-ACETAMINOPHEN 5-325 MG PO TABS: ORAL_TABLET | ORAL | 0 refills | Status: DC

## 2017-10-11 MED ORDER — PREDNISONE 20 MG PO TABS: ORAL_TABLET | ORAL | 0 refills | Status: DC

## 2017-10-11 NOTE — ED Triage Notes (Signed)
Pt presents to urgent care for right foot pain and swelling. Located in the great toe area. Started yesterday. He has a history of gout. He has tried tylenol without relief.

## 2017-10-11 NOTE — ED Provider Notes (Signed)
[facility]    CSN: [id number] Arrival date & time: 10/11/17  1227     History   Chief Complaint Chief Complaint  Patient presents with  . Foot Pain    right    HPI Ethan George is a 42 y.o. male.   42 yo male with a c/o right big toe joint pain and swelling since yesterday. Denies any injuries. States he has a h/o gout in the past in the same joint and symptoms are similar. Denies any fevers or chills.   The history is provided by the patient.  Foot Pain     Past Medical History:  Diagnosis Date  . GERD (gastroesophageal reflux disease)   . Gout     Patient Active Problem List   Diagnosis Date Noted  . Cellulitis and abscess of left leg 12/31/2016  . GERD (gastroesophageal reflux disease) 12/31/2016  . Gout   . 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 Current tobacco use 10/01/2014    Past Surgical History:  Procedure Laterality Date  . BACK SURGERY  2012,2016   X 2       Home Medications    Prior to Admission medications   Medication Sig Start Date End Date Taking? Authorizing Provider  cyclobenzaprine (FLEXERIL) 5 MG tablet Take 1 tablet (5 mg total) by mouth 3 (three) times daily as needed for muscle spasms. 12/20/16   Menshew, Charlesetta IvoryJenise V Bacon, PA-C  HYDROcodone-acetaminophen (NORCO/VICODIN) 5-325 MG tablet 1-2 tabs po bid prn 10/11/17   Payton Mccallumonty, Hampton Wixom, MD  ibuprofen (ADVIL,MOTRIN) 800 MG tablet Take 1 tablet (800 mg total) by mouth every 8 (eight) hours as needed. 07/28/17   Enid DerryWagner, Ashley, PA-C  omeprazole (PRILOSEC) 20 MG capsule Take 20 mg by mouth daily.    [provider]  oxyCODONE-acetaminophen (ROXICET) 5-325 MG tablet Take 1 tablet by mouth every 6 (six) hours as needed. 01/03/17 01/03/18  Auburn BilberryPatel, Shreyang, MD  predniSONE (DELTASONE) 20 MG tablet 3 tabs po qd x 2 days, then 2 tabs po qd x 3 days, then 1 tab po qd x 3 days, then half a tab po qd x 2 days 10/11/17   Payton Mccallumonty, Jubal Rademaker, MD  ranitidine (ZANTAC) 150  MG tablet Take 1 tablet (150 mg total) by mouth 2 (two) times daily. 12/03/15 01/02/16  Hagler, Ernestene KielJami L, PA-C    Family History Family History  Problem Relation Age of Onset  . Heart attack Maternal Grandfather     Social History Social History   Tobacco Use  . Smoking status: Current Every Day Smoker    Packs/day: 1.50    Years: 20.00    Pack years: 30.00    Types: Cigarettes  . Smokeless tobacco: Never Used  Substance Use Topics  . Alcohol use: No    Alcohol/week: 0.0 oz  . Drug use: No     Allergies   Ultram [tramadol hcl]   Review of Systems Review of Systems   Physical Exam Triage Vital Signs ED Triage Vitals  Enc Vitals Group     BP 10/11/17 1249 (!) 150/101     Pulse Rate 10/11/17 1249 65     Resp 10/11/17 1249 16     Temp 10/11/17 1249 97.6 F (36.4 C)     Temp Source 10/11/17 1249 Oral     SpO2 10/11/17 1249 98 %     Weight 10/11/17 1248 200 lb (90.7 kg)     Height 10/11/17 1248 5\' 9"  (1.753 m)  Head Circumference --      Peak Flow --      Pain Score 10/11/17 1248 10     Pain Loc --      Pain Edu? --      Excl. in GC? --    No data found.  Updated Vital Signs BP (!) 150/101 (BP Location: Left Arm)   Pulse 65   Temp 97.6 F (36.4 C) (Oral)   Resp 16   Ht 5\' 9"  (1.753 m)   Wt 200 lb (90.7 kg)   SpO2 98%   BMI 29.53 kg/m   Visual Acuity Right Eye Distance:   Left Eye Distance:   Bilateral Distance:    Right Eye Near:   Left Eye Near:    Bilateral Near:     Physical Exam  Constitutional: He appears well-developed and well-nourished. No distress.  Musculoskeletal:       Right foot: There is tenderness and bony tenderness (1st MTP joint).  Skin: He is not diaphoretic.  Nursing note and vitals reviewed.    UC Treatments / Results  Labs (all labs ordered are listed, but only abnormal results are displayed) Labs Reviewed - No data to display  EKG None  Radiology No results found.  Procedures Procedures (including  critical care time)  Medications Ordered in UC Medications - No data to display  Initial Impression / Assessment and Plan / UC Course  I have reviewed the triage vital signs and the nursing notes.  Pertinent labs & imaging results that were available during my care of the patient were reviewed by me and considered in my medical decision making (see chart for details).      Final Clinical Impressions(s) / UC Diagnoses   Final diagnoses:  Acute idiopathic gout of right foot    ED Prescriptions    Medication Sig Dispense Auth. Provider   predniSONE (DELTASONE) 20 MG tablet 3 tabs po qd x 2 days, then 2 tabs po qd x 3 days, then 1 tab po qd x 3 days, then half a tab po qd x 2 days 16 tablet Riot Barrick, Pamala Hurry, MD   HYDROcodone-acetaminophen (NORCO/VICODIN) 5-325 MG tablet 1-2 tabs po bid prn 8 tablet Rommel Hogston, Pamala Hurry, MD     1.  diagnosis reviewed with patient 2. rx as per orders above; reviewed possible side effects, interactions, risks and benefits  3. Recommend supportive treatment with rest, ice, elevation 4. Follow-up prn if symptoms worsen or don't improve  Controlled Substance Prescriptions [place] Controlled Substance Registry consulted? Not Applicable   Payton Mccallum, MD 10/11/17 [phone]

## 2017-10-27 ENCOUNTER — Ambulatory Visit
Admission: EM | Admit: 2017-10-27 | Discharge: 2017-10-27 | Disposition: A | Discharge status: Home / Self Care | Payer: Managed Care, Other (non HMO) | Attending: Internal Medicine | Admitting: Internal Medicine

## 2017-10-27 ENCOUNTER — Other Ambulatory Visit: Payer: Self-pay

## 2017-10-27 DIAGNOSIS — M1A9XX0 Chronic gout, unspecified, without tophus (tophi): Secondary | ICD-10-CM | POA: Diagnosis not present

## 2017-10-27 MED ORDER — NAPROXEN 500 MG PO TABS: 500.0000 mg | ORAL_TABLET | Freq: Two times a day (BID) | ORAL | 0 refills | Status: DC

## 2017-10-27 MED ORDER — KETOROLAC TROMETHAMINE 60 MG/2ML IM SOLN
60.0000 mg | Freq: Once | INTRAMUSCULAR | Status: AC
  Administered 2017-10-27: 60 mg via INTRAMUSCULAR

## 2017-10-27 NOTE — ED Provider Notes (Signed)
[facility]    CSN: [id number] Arrival date & time: 10/27/17  [id number]     History   Chief Complaint Chief Complaint  Patient presents with  . Gout    HPI Tomasita CrumbleRodney Tenzin Edelman Cathy is a 42 y.o. male.   HPI  42 year old male with known gout returns after being seen here 2 weeks ago for the same problem given prednisone and although it helped somewhat did not completely eliminate his gout.  Initially the prednisone did seem to flare back up.  Pain is an 8 out of 10.  Used colchicine in the past with good success well as NSAIDs         Past Medical History:  Diagnosis Date  . GERD (gastroesophageal reflux disease)   . Gout     Patient Active Problem List   Diagnosis Date Noted  . Cellulitis and abscess of left leg 12/31/2016  . GERD (gastroesophageal reflux disease) 12/31/2016  . Gout   . 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 Current tobacco use 10/01/2014    Past Surgical History:  Procedure Laterality Date  . BACK SURGERY  2012,2016   X 2       Home Medications    Prior to Admission medications   Medication Sig Start Date End Date Taking? Authorizing Provider  naproxen (NAPROSYN) 500 MG tablet Take 1 tablet (500 mg total) by mouth 2 (two) times daily with a meal. 10/27/17   Lutricia Feiloemer, Alaze Garverick P, PA-C  ranitidine (ZANTAC) 150 MG tablet Take 1 tablet (150 mg total) by mouth 2 (two) times daily. 12/03/15 01/02/16  Hagler, Ernestene KielJami L, PA-C    Family History Family History  Problem Relation Age of Onset  . Heart attack Maternal Grandfather     Social History Social History   Tobacco Use  . Smoking status: Current Every Day Smoker    Packs/day: 1.50    Years: 20.00    Pack years: 30.00    Types: Cigarettes  . Smokeless tobacco: Never Used  Substance Use Topics  . Alcohol use: No    Alcohol/week: 0.0 standard drinks  . Drug use: No     Allergies   Ultram [tramadol hcl]   Review of Systems Review of Systems  Constitutional:  Positive for activity change. Negative for chills, fatigue and fever.  Musculoskeletal: Positive for arthralgias and joint swelling.  All other systems reviewed and are negative.    Physical Exam Triage Vital Signs ED Triage Vitals  Enc Vitals Group     BP 10/27/17 0853 127/74     Pulse Rate 10/27/17 0853 71     Resp 10/27/17 0853 18     Temp 10/27/17 0853 97.6 F (36.4 C)     Temp Source 10/27/17 0853 Oral     SpO2 10/27/17 0853 97 %     Weight 10/27/17 0855 199 lb 15.3 oz (90.7 kg)     Height 10/27/17 0855 5\' 9"  (1.753 m)     Head Circumference --      Peak Flow --      Pain Score 10/27/17 0855 8     Pain Loc --      Pain Edu? --      Excl. in GC? --    No data found.  Updated Vital Signs BP 127/74 (BP Location: Left Arm)   Pulse 71   Temp 97.6 F (36.4 C) (Oral)   Resp 18   Ht 5\' 9"  (1.753 m)   Wt 199 lb  15.3 oz (90.7 kg)   SpO2 97%   BMI 29.53 kg/m   Visual Acuity Right Eye Distance:   Left Eye Distance:   Bilateral Distance:    Right Eye Near:   Left Eye Near:    Bilateral Near:     Physical Exam  Constitutional: He is oriented to person, place, and time. He appears well-developed and well-nourished. No distress.  HENT:  Head: Normocephalic.  Eyes: Pupils are equal, round, and reactive to light.  Neck: Normal range of motion.  Musculoskeletal: He exhibits edema and tenderness.  Exam of right foot first MTP shows swelling warmth and is significantly tender.  Patient does like to be touched or the joint moved.  Neurological: He is alert and oriented to person, place, and time.  Skin: Skin is warm and dry. He is not diaphoretic.  Psychiatric: He has a normal mood and affect. His behavior is normal. Judgment and thought content normal.  Vitals reviewed.    UC Treatments / Results  Labs (all labs ordered are listed, but only abnormal results are displayed) Labs Reviewed - No data to display  EKG None  Radiology No results  found.  Procedures Procedures (including critical care time)  Medications Ordered in UC Medications  ketorolac (TORADOL) injection 60 mg (60 mg Intramuscular Given 10/27/17 0948)    Initial Impression / Assessment and Plan / UC Course  I have reviewed the triage vital signs and the nursing notes.  Pertinent labs & imaging results that were available during my care of the patient were reviewed by me and considered in my medical decision making (see chart for details).     Plan: 1. Test/x-ray results and diagnosis reviewed with patient 2. rx as per orders; risks, benefits, potential side effects reviewed with patient 3. Recommend supportive treatment with watching her diet carefully to help precipitation of symptoms.  I will initiate treatment with a injection of Toradol and start him on Naprosyn 500 mg twice daily with food.  Appointment with a new primary care physician next week and he will discuss placing him on allopurinol or similar drug at that time. 4. F/u prn if symptoms worsen or don't improve  Final Clinical Impressions(s) / UC Diagnoses   Final diagnoses:  Chronic gout involving toe of right foot without tophus, unspecified cause   Discharge Instructions   None    ED Prescriptions    Medication Sig Dispense Auth. Provider   naproxen (NAPROSYN) 500 MG tablet Take 1 tablet (500 mg total) by mouth 2 (two) times daily with a meal. 60 tablet Lutricia Feiloemer, Brix Brearley P, PA-C     Controlled Substance Prescriptions [place] Controlled Substance Registry consulted? Not Applicable   Lutricia FeilRoemer, Salvatore Poe P, PA-C 10/27/17 1659

## 2017-10-27 NOTE — ED Triage Notes (Signed)
Pt with gout in right great toe. Was seen here 2 weeks ago for same and given Prednisone. Has finished it and now it flared back up. Pain 8/10

## 2017-10-28 ENCOUNTER — Other Ambulatory Visit: Payer: Self-pay

## 2017-10-28 ENCOUNTER — Encounter: Payer: Self-pay | Admitting: Emergency Medicine

## 2017-10-28 ENCOUNTER — Ambulatory Visit: Payer: Managed Care, Other (non HMO) | Admitting: Podiatry

## 2017-10-28 ENCOUNTER — Encounter: Payer: Self-pay | Admitting: Podiatry

## 2017-10-28 ENCOUNTER — Ambulatory Visit (INDEPENDENT_AMBULATORY_CARE_PROVIDER_SITE_OTHER): Payer: Managed Care, Other (non HMO)

## 2017-10-28 ENCOUNTER — Other Ambulatory Visit: Payer: Self-pay | Admitting: Podiatry

## 2017-10-28 VITALS — BP 138/83 | HR 74

## 2017-10-28 DIAGNOSIS — M79674 Pain in right toe(s): Secondary | ICD-10-CM | POA: Diagnosis present

## 2017-10-28 DIAGNOSIS — M109 Gout, unspecified: Secondary | ICD-10-CM

## 2017-10-28 DIAGNOSIS — R52 Pain, unspecified: Secondary | ICD-10-CM

## 2017-10-28 DIAGNOSIS — M10071 Idiopathic gout, right ankle and foot: Secondary | ICD-10-CM | POA: Diagnosis not present

## 2017-10-28 DIAGNOSIS — F17210 Nicotine dependence, cigarettes, uncomplicated: Secondary | ICD-10-CM | POA: Insufficient documentation

## 2017-10-28 LAB — URIC ACID: Uric Acid, Serum: 10.8 mg/dL — ABNORMAL HIGH (ref 3.7–8.6)

## 2017-10-28 MED ORDER — COLCHICINE 0.6 MG PO TABS: 0.6000 mg | ORAL_TABLET | ORAL | 0 refills | Status: DC | PRN

## 2017-10-28 MED ORDER — COLCHICINE 0.6 MG PO TABS: 0.6000 mg | ORAL_TABLET | Freq: Two times a day (BID) | ORAL | 0 refills | Status: DC

## 2017-10-28 NOTE — Progress Notes (Signed)
This patient presents the office with chief complaint of a painful right foot.  Patient states that the pain started 2 weeks ago and has not improved in that period of time.  [facility] [place]   [place] with [place].  He was prescribed deltasone and told to return to the clinic if pain continued.  He said the pain was severe again, so he was seen at the emergency department [place] yesterday.He was prescribed Naprosyn for the pain.  He presents to the office today with his wife for an evaluation of his painful foot.  He has been walking on this painful foot for the last 2 weeks and has been working at a regular shift daily.  He says that he has a history of gout, which previously was treated with colchicine and Indocin.  He presents the office today for evaluation and  treatment of his painful right foot. right foot.  General Appearance  Alert, conversant and in no acute stress.  Vascular  Dorsalis pedis and posterior tibial  pulses are palpable  bilaterally.  Capillary return is within normal limits  bilaterally. Temperature is within normal limits  bilaterally.  Neurologic  Senn-Weinstein monofilament wire test within normal limits  bilaterally. Muscle power within normal limits bilaterally.  Nails Normal nails noted with no evidence of bacterial or fungal infection.  Orthopedic  No limitations of motion of motion feet .  No crepitus or effusions noted.  No bony pathology or digital deformities noted. Patient has red inflamed big toe joint right foot.  He has marked swelling through the whole dorsum of the right foot.  There is increased temperature through the dorsum of right foot.   Patient has severe pain noted upon  MPJ right foot.  Skin  normotropic skin with no porokeratosis noted bilaterally.  No signs of infections or ulcers noted.    Acute Gout episode.  IE  Xrays reveal elongated and elevated big toe joint right foot.  No other  pathology noted. Discussed this condition with this patient.  Told him that it appears he had a gout attack 2 weeks ago and he continued to walk and work causing the whole foot to become inflamed and severely painful.  The cortisone took by mouth was successful but he needed further medication.  He presented to the emergency room yesterday and received Naprosyn.  He presents to   my office today where x-rays were taken. I reviewed his previous visits to the emergency department.  There appears to be an inflamed foot caused by his gout attack.  I decided to prescribe him colchicine to be taken by mouth for the next 3 days.  He was told he could then take the Naprosyn which she received yesterday following the first 3 days.  He was also dispensed a cam walker in an effort to help control and limit his motion in his right foot. He is to return to the office in 3 days for further evaluation and treatment of his right foot.  If the pain is localized, we can then consider injection therapy.  Helane GuntherGregory Shylyn Younce DPM

## 2017-10-28 NOTE — ED Triage Notes (Signed)
Pt to triage via w/c with no distress noted; pt reports hx gout; several wks ago began having gout pain to right great toe; [facility] prednisone taper as well as receiving some type of injection; f/u with "specialist" today and had xray and rx colchicine by pain persists with swelling not into foot and ankle; pt reports that he has had no labs performed; velcro boot in place

## 2017-10-29 ENCOUNTER — Emergency Department
Admission: EM | Admit: 2017-10-29 | Discharge: 2017-10-29 | Disposition: A | Discharge status: Home / Self Care | Payer: Managed Care, Other (non HMO) | Attending: Emergency Medicine | Admitting: Emergency Medicine

## 2017-10-29 DIAGNOSIS — M109 Gout, unspecified: Secondary | ICD-10-CM

## 2017-10-29 LAB — COMPREHENSIVE METABOLIC PANEL
ALT: 36 U/L (ref 0–44)
AST: 27 U/L (ref 15–41)
Albumin: 4.4 g/dL (ref 3.5–5.0)
Alkaline Phosphatase: 79 U/L (ref 38–126)
Anion gap: 8 (ref 5–15)
BUN: 13 mg/dL (ref 6–20)
CO2: 25 mmol/L (ref 22–32)
Calcium: 8.8 mg/dL — ABNORMAL LOW (ref 8.9–10.3)
Chloride: 106 mmol/L (ref 98–111)
Creatinine, Ser: 0.79 mg/dL (ref 0.61–1.24)
GFR calc Af Amer: >60 mL/min (ref >60)
GFR calc non Af Amer: >60 mL/min (ref >60)
Glucose, Bld: 146 mg/dL — ABNORMAL HIGH (ref 70–99)
Potassium: 3.5 mmol/L (ref 3.5–5.1)
Sodium: 139 mmol/L (ref 135–145)
Total Bilirubin: 0.8 mg/dL (ref 0.3–1.2)
Total Protein: 7.8 g/dL (ref 6.5–8.1)

## 2017-10-29 LAB — CBC
HCT: 41.8 % (ref 40.0–52.0)
Hemoglobin: 14.5 g/dL (ref 13.0–18.0)
MCH: 32.8 pg (ref 26.0–34.0)
MCHC: 34.7 g/dL (ref 32.0–36.0)
MCV: 94.3 fL (ref 80.0–100.0)
Platelets: 282 10*3/uL (ref 150–440)
RBC: 4.43 MIL/uL (ref 4.40–5.90)
RDW: 13.4 % (ref 11.5–14.5)
WBC: 8.7 10*3/uL (ref 3.8–10.6)

## 2017-10-29 LAB — LIPASE, BLOOD: Lipase: 32 U/L (ref 11–51)

## 2017-10-29 MED ORDER — OXYCODONE-ACETAMINOPHEN 5-325 MG PO TABS: 1.0000 | ORAL_TABLET | ORAL | 0 refills | Status: DC | PRN

## 2017-10-29 MED ORDER — SODIUM CHLORIDE 0.9 % IV BOLUS
1000.0000 mL | Freq: Once | INTRAVENOUS | Status: AC
  Administered 2017-10-29: 1000 mL via INTRAVENOUS

## 2017-10-29 MED ORDER — GI COCKTAIL ~~LOC~~
30.0000 mL | Freq: Once | ORAL | Status: AC
  Administered 2017-10-29: 30 mL via ORAL
  Filled 2017-10-29: qty 30

## 2017-10-29 MED ORDER — INDOMETHACIN 50 MG PO CAPS
50.0000 mg | ORAL_CAPSULE | Freq: Once | ORAL | Status: AC
  Administered 2017-10-29: 50 mg via ORAL
  Filled 2017-10-29: qty 1

## 2017-10-29 MED ORDER — MORPHINE SULFATE (PF) 4 MG/ML IV SOLN
4.0000 mg | Freq: Once | INTRAVENOUS | Status: AC
  Administered 2017-10-29: 4 mg via INTRAVENOUS
  Filled 2017-10-29: qty 1

## 2017-10-29 MED ORDER — ONDANSETRON HCL 4 MG/2ML IJ SOLN
4.0000 mg | Freq: Once | INTRAMUSCULAR | Status: AC
  Administered 2017-10-29: 4 mg via INTRAVENOUS
  Filled 2017-10-29: qty 2

## 2017-10-29 NOTE — Discharge Instructions (Addendum)
Please follow up with your podiatrist

## 2017-10-29 NOTE — ED Provider Notes (Signed)
[facility] Emergency Department Provider Note   ____________________________________________   First MD Initiated Contact with Patient 10/29/17 0144     (approximate)  I have reviewed the triage vital signs and the nursing notes.   HISTORY  Chief Complaint Foot Pain    HPI Ethan George is a 42 y.o. male who comes into the hospital today with some pain to his right great toe.  He reports that he has gout.  The flare started about 2 weeks ago.  He went to urgent care and was given prednisone initially.  He reports that he was on prednisone for 10 days but it did not help.  He was then put on naproxen and told to follow-up with podiatry.  The patient saw a podiatrist today and was given colchicine to help his symptoms.  The patient's family states that the swelling has gotten worse although they have been icing and elevated.  The foot doctor is sure that this is gout and felt that the colchicine would help.  The patient has pain that he rates a 9 out of 10 in intensity.  He denies any chest pain or fevers but he did have some nausea vomiting and abdominal pain while in the waiting room.  He is here today for evaluation of his symptoms.   Past Medical History:  Diagnosis Date  . GERD (gastroesophageal reflux disease)   . Gout     Patient Active Problem List   Diagnosis Date Noted  . Cellulitis and abscess of left leg 12/31/2016  . GERD (gastroesophageal reflux disease) 12/31/2016  . Gout   . 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 Current tobacco use 10/01/2014    Past Surgical History:  Procedure Laterality Date  . BACK SURGERY  2012,2016   X 2    Prior to Admission medications   Medication Sig Start Date End Date Taking? Authorizing Provider  colchicine 0.6 MG tablet Take 1 tablet (0.6 mg total) by mouth 2 (two) times daily. Take q 2 hours with a max. Of 3/daily Patient taking differently: Take 0.6 mg by mouth daily. Take  q 2 hours with a max. Of 3/daily 10/28/17   Helane GuntherMayer, Gregory, DPM  naproxen (NAPROSYN) 500 MG tablet Take 1 tablet (500 mg total) by mouth 2 (two) times daily with a meal. 10/27/17   Lutricia Feiloemer, William P, PA-C  Omeprazole Magnesium (PRILOSEC OTC PO) Take by mouth as needed.    [provider]  oxyCODONE-acetaminophen (PERCOCET/ROXICET) 5-325 MG tablet Take 1-2 tablets by mouth every 4 (four) hours as needed for severe pain. 10/29/17   Rebecka ApleyWebster, Adrian Dinovo P, MD    Allergies Tramadol and Ultram Marcia Brash[tramadol hcl]  Family History  Problem Relation Age of Onset  . Heart attack Maternal Grandfather     Social History Social History   Tobacco Use  . Smoking status: Current Every Day Smoker    Packs/day: 1.50    Years: 20.00    Pack years: 30.00    Types: Cigarettes  . Smokeless tobacco: Never Used  Substance Use Topics  . Alcohol use: No    Alcohol/week: 0.0 standard drinks  . Drug use: No    Review of Systems  Constitutional: No fever/chills Eyes: No visual changes. ENT: No sore throat. Cardiovascular: Denies chest pain. Respiratory: Denies shortness of breath. Gastrointestinal: No abdominal pain.  No nausea, no vomiting.  No diarrhea.  No constipation. Genitourinary: Negative for dysuria. Musculoskeletal: Right great toe pain Skin: Negative for  rash. Neurological: Negative for headaches, focal weakness or numbness.   ____________________________________________   PHYSICAL EXAM:  VITAL SIGNS: ED Triage Vitals  Enc Vitals Group     BP 10/28/17 2319 116/71     Pulse Rate 10/28/17 2319 66     Resp 10/28/17 2319 16     Temp 10/28/17 2319 98.1 F (36.7 C)     Temp Source 10/28/17 2319 Oral     SpO2 10/28/17 2319 95 %     Weight 10/28/17 2307 200 lb (90.7 kg)     Height 10/28/17 2307 5\' 9"  (1.753 m)     Head Circumference --      Peak Flow --      Pain Score 10/28/17 2307 9     Pain Loc --      Pain Edu? --      Excl. in GC? --     Constitutional: Alert and  oriented. Well appearing and in moderate distress. Eyes: Conjunctivae are normal. PERRL. EOMI. Head: Atraumatic. Nose: No congestion/rhinnorhea. Mouth/Throat: Mucous membranes are moist.  Oropharynx non-erythematous. Cardiovascular: Normal rate, regular rhythm. Grossly normal heart sounds.  Good peripheral circulation. Respiratory: Normal respiratory effort.  No retractions. Lungs CTAB. Gastrointestinal: Soft and nontender. No distention.  Positive bowel sounds Musculoskeletal: Swelling to the base of the right great toe with some erythema to the base of the toe as well as the dorsum of the foot. Neurologic:  Normal speech and language.  Skin:  Skin is warm, dry and intact.  Psychiatric: Mood and affect are normal.  ____________________________________________   LABS (all labs ordered are listed, but only abnormal results are displayed)  Labs Reviewed  URIC ACID - Abnormal; Notable for the following components:      Result Value   Uric Acid, Serum 10.8 (*)    All other components within normal limits  COMPREHENSIVE METABOLIC PANEL - Abnormal; Notable for the following components:   Glucose, Bld 146 (*)    Calcium 8.8 (*)    All other components within normal limits  CBC  LIPASE, BLOOD   ____________________________________________  EKG  none ____________________________________________  RADIOLOGY  ED MD interpretation:  none  Official radiology report(s): Dg Foot Complete Right  Result Date: 10/28/2017 Please see detailed radiograph report in office note.   ____________________________________________   PROCEDURES  Procedure(s) performed: None  Procedures  Critical Care performed: No  ____________________________________________   INITIAL IMPRESSION / ASSESSMENT AND PLAN / ED COURSE  As part of my medical decision making, I reviewed the following data within the electronic MEDICAL RECORD NUMBER Notes from prior ED visits and [place] Controlled Substance  Database   This is a 42 year old male who comes into the hospital today with gout.  He was seen by podiatry today who feel that the patient has an acute gout episode but the patient came in tonight with worsening pain.  He did have some belly pain and started having some vomiting this evening.  We did check a uric acid level which was 10.8.   The podiatry note states that the prednisone did help but because the patient continue walking on this foot it caused the inflammation to return and because the gout attack to continue.  He was prescribed colchicine.  I will give the patient some morphine and Zofran for his pain.  Also because he is having some mild abdominal pain and vomiting I will check some more blood work.  He will be reassessed.   The patient had some blood work  drawn which was unremarkable.  I did give the patient some medicine and his pain was improved.  He was sleeping comfortably.  The redness of the patient's foot was also improved.  I did have a conversation with the patient about following up with his podiatrist who will perform a cortisone injection should the pain persists.  The patient understands and agrees.  He will be discharged home to follow-up with his podiatrist.      ____________________________________________   FINAL CLINICAL IMPRESSION(S) / ED DIAGNOSES  Final diagnoses:  Acute gout involving toe of right foot, unspecified cause     ED Discharge Orders         Ordered    oxyCODONE-acetaminophen (PERCOCET/ROXICET) 5-325 MG tablet  Every 4 hours PRN     10/29/17 0401           Note:  This document was prepared using Dragon voice recognition software and may include unintentional dictation errors.    Rebecka Apley, MD 10/29/17 [phone]

## 2017-10-29 NOTE — ED Notes (Signed)
Pt discharged to home.  Family member driving.  Discharge instructions reviewed.  Verbalized understanding.  No questions or concerns at this time.  Teach back verified.  Pt in NAD.  No items left in ED.   

## 2017-10-29 NOTE — ED Notes (Signed)
Pt and significant other at bedside report that patient has [facility] and at [facility] for gout.  Pt was put on a round of prednisone and colchicine for gout and neither is helping.  Area to foot noted to be bright red and swollen upon assessment.  Pt is A&Ox4, sleeping comfortable when this RN came into room.  Per significant other, pt also vomited x2 in lobby.

## 2017-10-31 ENCOUNTER — Ambulatory Visit: Payer: Managed Care, Other (non HMO) | Admitting: Podiatry

## 2017-11-01 ENCOUNTER — Telehealth: Payer: Self-pay | Admitting: Podiatry

## 2017-11-01 NOTE — Telephone Encounter (Signed)
Pt wanted you to call in medication for gout

## 2017-11-05 ENCOUNTER — Other Ambulatory Visit: Payer: Self-pay

## 2017-11-05 ENCOUNTER — Telehealth: Payer: Self-pay | Admitting: Podiatry

## 2017-11-05 MED ORDER — ALLOPURINOL 100 MG PO TABS: 100.0000 mg | ORAL_TABLET | Freq: Every day | ORAL | 0 refills | Status: DC

## 2017-11-05 NOTE — Telephone Encounter (Signed)
Pt needs a refill of Alpurinol, pts Wife said that Dr. Stacie AcresMayer said he would call it in if the Gout flares up. [facility] Pt. Is in extreme pain.

## 2017-11-05 NOTE — Telephone Encounter (Signed)
Dr. Stacie AcresMayer, is it ok to call in Allopurinol for patient?  Please advise

## 2017-11-05 NOTE — Telephone Encounter (Signed)
Filled allopurinol Rx, advised patient to go to the ER per Dr Stacie AcresMayer due to extreme pain

## 2017-11-05 NOTE — Telephone Encounter (Signed)
This patient and /or wife  has called the office probably in the afternoon requesting medication for his gout.  I asked Shanda BumpsJessica to call zyloprim   into the pharmacy for this patient.  His  called later in the afternoon at approximately 4:30 stating that there was severe pain again in his foot. After thinking about treatment and the timing today,. I recommended that he proceed to the emergency room for an evaluation of his foot.  After seeing patients . I personally called him  on his phone to talk directly to this patient.  He said he was driving home and that his [place] had been [place] in [place].  He says that his foot has started to become increasingly painful and swollen and that was the reason he called to the office.  He says that after being seen last week his foot improved and the swelling and the pain diminished.  He says he now has started to have another episode.   I told this patient that t usually improves and gets better and I am concerned that something else is going on.  He says the inflammation has localized to big toe joint. That is the reason I recommended a visit to the emergency department.  He is scheduled to be seen in the office on Thursday by myself for an update and treatment.     Helane GuntherGregory Esteen Delpriore DPM

## 2017-11-07 ENCOUNTER — Encounter: Payer: Self-pay | Admitting: Podiatry

## 2017-11-07 ENCOUNTER — Ambulatory Visit: Payer: Managed Care, Other (non HMO) | Admitting: Podiatry

## 2017-11-07 DIAGNOSIS — R52 Pain, unspecified: Secondary | ICD-10-CM | POA: Diagnosis not present

## 2017-11-07 DIAGNOSIS — M109 Gout, unspecified: Secondary | ICD-10-CM | POA: Diagnosis not present

## 2017-11-07 MED ORDER — HYDROCODONE-ACETAMINOPHEN 5-325 MG PO TABS: ORAL_TABLET | ORAL | 0 refills | Status: DC

## 2017-11-07 NOTE — Progress Notes (Signed)
This patient presents the office with continued pain noted in his big toe of his right foot.  He is walking with an antalgic gait into the treatment room.  He says that his pain is located in the big toe joint and along the inside of the big toe joint.  He presents the office today for continued evaluation and treatment of gout.  He presents the office today stating that most of the swelling  He says he did not go to the emergency room on Tuesday per my recommendation.  He states he needed to be home to take care of his wife who just had a cesarean.  He says most of the swelling on the top of his right foot has resolved.  He presents the office today for continued evaluation and treatment.  Vascular  Dorsalis pedis and posterior tibial pulses are palpable  B/L.  Capillary return  WNL.  Temperature gradient is  WNL.  Skin turgor  WNL  Sensorium  Senn Weinstein monofilament wire  WNL. Normal tactile sensation.  Nail Exam  Patient has normal nails with no evidence of bacterial or fungal infection.  Orthopedic  Exam  Muscle tone and muscle strength  WNL.  There is continued swelling, redness and pain of range of motion of the first MPJ right foot.  Pain  is very severe upon palpation. Foot type is unremarkable and digits show no abnormalies.  Localized inflammation noted 1st MPJ  Right foot.  Skin  No open lesions.  Normal skin texture and turgor.  Gout right foot.   ROV.  Patient has improvement in his gout flare up except for the big toe joint, right foot. Here is still significant pain and discomfort upon palpation and range of motion.  We discussed this condition.  I decided to provide a Mayo block in an effort to help anesthetize his big toe joint .  6cc. Xylocaine was  used in the mayo block. Injection therapy including an intra articular and periarticular injection was performed 1st MPJ right foot.  Injection therapy using 1.0 cc. Of 2% xylocaine( 20 mg.) plus 1 cc. of kenalog-la ( 10 mg) plus 1/2  cc. of dexamethazone phosphate ( 2 mg).  Prescribed vicodin tabs 5/325 for this patient.  Patient to return in one week.  He says he will rest his foot this weekend and hopes to only work a half day tomorrow.   Ethan GuntherGregory Tiawanna George DPM

## 2017-11-18 ENCOUNTER — Ambulatory Visit: Payer: Managed Care, Other (non HMO) | Admitting: Podiatry

## 2017-12-05 ENCOUNTER — Encounter: Payer: Self-pay | Admitting: Podiatry

## 2017-12-05 ENCOUNTER — Ambulatory Visit: Payer: Managed Care, Other (non HMO) | Admitting: Podiatry

## 2017-12-05 DIAGNOSIS — R52 Pain, unspecified: Secondary | ICD-10-CM

## 2017-12-05 DIAGNOSIS — M109 Gout, unspecified: Secondary | ICD-10-CM

## 2017-12-05 MED ORDER — MELOXICAM 15 MG PO TABS: 15.0000 mg | ORAL_TABLET | Freq: Every day | ORAL | 1 refills | Status: DC

## 2017-12-05 NOTE — Progress Notes (Signed)
This patient presents to the office stating he is having a flare up of his big toe right foot.  He says that approximately 5 days ago it started to become painful and sore and swollen than his right foot.  He states that the pain has localized nail to the big toe joint of the right foot.  He states he has severe pain and discomfort in the joint and has difficulty walking.  He says that since his last visit 3-4 weeks ago his foot has been well an any pain or discomfort.  He presents the office today for continued evaluation and treatment of this acute gout episode.  Vascular  Dorsalis pedis and posterior tibial pulses are palpable  B/L.  Capillary return  WNL.  Temperature gradient is  WNL.  Skin turgor  WNL  Sensorium  Senn Weinstein monofilament wire  WNL. Normal tactile sensation.  Nail Exam  Patient has normal nails with no evidence of bacterial or fungal infection.  Orthopedic  Exam  Muscle tone and muscle strength  WNL.  No limitations of motion feet  B/L.  No crepitus or joint effusion noted.  Foot type is unremarkable and digits show no abnormalities.,Patient has  Swelling,redn ess and pain noted in the first MPJ  Right foot..  Painful ROM 1st MPJ right foot.  Skin  No open lesions.  Normal skin texture and turgor.  Gout   ROV.  Intraarticular injection and periarticular injection right 1st MPJ. Injection therapy using 1.0 cc. Of 2% xylocaine( 20 mg.) plus 1 cc. of kenalog-la ( 10 mg) plus 1/2 cc. of dexamethazone phosphate ( 2 mg)  Patient was also prescribed Mobic to be taken one daily.  Patient was instructed to keep his appointment with his medical doctor to be evaluated for these chronic acute gout attacks.  RTC prn.   Helane Gunther DPM

## 2018-10-06 ENCOUNTER — Other Ambulatory Visit: Payer: Self-pay

## 2018-10-06 DIAGNOSIS — M109 Gout, unspecified: Secondary | ICD-10-CM

## 2018-10-06 NOTE — Progress Notes (Signed)
I spoke with [facility], I will fax demographics, insurance card and office note, pertinent labs and she will call the patient to schedule.

## 2018-10-06 NOTE — Progress Notes (Signed)
Patient called requesting a referral to Dr. [facility] for Gout flare up    Per Dr. Prudence Davidson, ok to refer.   Called [facility] to schedule appt, but left message with Anderson Malta, NP Coordinator to call back.

## 2018-10-06 NOTE — Progress Notes (Signed)
Appt has been scheduled with Leafy Kindle, PA-C at [facility] on 10/08/2018 @ 10am.

## 2018-12-29 ENCOUNTER — Ambulatory Visit: Payer: Managed Care, Other (non HMO) | Admitting: Podiatry

## 2018-12-29 ENCOUNTER — Encounter: Payer: Self-pay | Admitting: Podiatry

## 2018-12-29 ENCOUNTER — Ambulatory Visit (INDEPENDENT_AMBULATORY_CARE_PROVIDER_SITE_OTHER): Payer: Managed Care, Other (non HMO)

## 2018-12-29 ENCOUNTER — Other Ambulatory Visit: Payer: Self-pay

## 2018-12-29 DIAGNOSIS — S93601A Unspecified sprain of right foot, initial encounter: Secondary | ICD-10-CM | POA: Diagnosis not present

## 2018-12-29 DIAGNOSIS — S93609A Unspecified sprain of unspecified foot, initial encounter: Secondary | ICD-10-CM | POA: Insufficient documentation

## 2018-12-29 DIAGNOSIS — M722 Plantar fascial fibromatosis: Secondary | ICD-10-CM

## 2018-12-29 MED ORDER — MELOXICAM 15 MG PO TABS: 15.0000 mg | ORAL_TABLET | Freq: Every day | ORAL | 1 refills | Status: DC

## 2018-12-29 NOTE — Progress Notes (Signed)
This patient presents the office stating he is having pain in both feet.  He says the pain has been present for weeks and is increasing in severity.  He states he is experiencing severe pain upon rising in the morning and standing from a sitting position.  Patient states he is having pain in his left heel and points to the center of the left heel as the site of most pain.  He says his pain level in his left heel is approximately 5 out of 10.  He also says he has pain noted on the outside of his midfoot extending to his fifth toe.  He says this pain has been severe and has kept him from walking properly.  He says he compensates for the pain on his right foot by placing more weight on his left heel.  This patient has previously been diagnosed with gout and was referred to a rheumatologist.  The rheumatologist has provided allopurinol and colchicine for this patient to take daily.  He presents the office today for an evaluation and treatment of his painful feet.  Vascular  Dorsalis pedis and posterior tibial pulses are palpable  B/L.  Capillary return  WNL.  Temperature gradient is  WNL.  Skin turgor  WNL  Sensorium  Senn Weinstein monofilament wire  WNL. Normal tactile sensation.  Nail Exam  Patient has normal nails with no evidence of bacterial or fungal infection.  Orthopedic  Exam  Muscle tone and muscle strength  WNL.  No limitations of motion feet  B/L.  No crepitus or joint effusion noted.  Foot type is unremarkable and digits show no abnormalities.  Mild  HAV  B/L.  Palpable pain noted at the insertion of the plantar fascia of the left heel.  Patient also has pain noted at the base of the fifth metatarsal cuneiform articulation.  Pain upon range of motion of the fifth ray.  Skin  No open lesions.  Normal skin texture and turgor.  Plantar fasciitis left heel  Foot Sprain right foot.  ROV.  X-rays revealed no calcification at the insertion of the plantar fascia left foot.  X-rays revealed no bony  pathology noted.  On the right foot.  Discussed this condition with this patient.  Was dispensed power step insoles and told to wear them in his work shoes.  The insoles will help to support the plantar fascial left foot and limit hypermobility of the right foot.  Patient was also prescribed Mobic 15 mg with instructions to take 1 daily.  He was told to discontinue his colchicine until he discontinues the Mobic and restart the colchicine.  Patient was given an appointment for 2 weeks for for follow-up exam and if the problem persist we will consider injection therapy.  RTC 2 weeks   Gardiner Barefoot DPM

## 2019-01-15 ENCOUNTER — Ambulatory Visit: Payer: Managed Care, Other (non HMO) | Admitting: Podiatry

## 2019-01-20 ENCOUNTER — Other Ambulatory Visit: Payer: Self-pay

## 2019-01-20 DIAGNOSIS — Z20822 Contact with and (suspected) exposure to covid-19: Secondary | ICD-10-CM

## 2019-01-22 ENCOUNTER — Telehealth: Payer: Self-pay | Admitting: General Practice

## 2019-01-22 LAB — NOVEL CORONAVIRUS, NAA: SARS-CoV-2, NAA: NOT DETECTED

## 2019-01-22 NOTE — Telephone Encounter (Signed)
Pt called in for result.   Advised of Not Detected result.

## 2019-03-23 ENCOUNTER — Ambulatory Visit
Admission: EM | Admit: 2019-03-23 | Discharge: 2019-03-23 | Disposition: A | Discharge status: Home / Self Care | Payer: Managed Care, Other (non HMO) | Attending: Emergency Medicine | Admitting: Emergency Medicine

## 2019-03-23 ENCOUNTER — Ambulatory Visit (INDEPENDENT_AMBULATORY_CARE_PROVIDER_SITE_OTHER): Payer: Managed Care, Other (non HMO)

## 2019-03-23 ENCOUNTER — Other Ambulatory Visit: Payer: Self-pay

## 2019-03-23 DIAGNOSIS — U071 COVID-19: Secondary | ICD-10-CM

## 2019-03-23 DIAGNOSIS — J208 Acute bronchitis due to other specified organisms: Secondary | ICD-10-CM

## 2019-03-23 DIAGNOSIS — R05 Cough: Secondary | ICD-10-CM

## 2019-03-23 MED ORDER — DOXYCYCLINE HYCLATE 100 MG PO CAPS: 100.0000 mg | ORAL_CAPSULE | Freq: Two times a day (BID) | ORAL | 0 refills | Status: AC

## 2019-03-23 MED ORDER — BENZONATATE 200 MG PO CAPS: 200.0000 mg | ORAL_CAPSULE | Freq: Three times a day (TID) | ORAL | 0 refills | Status: DC | PRN

## 2019-03-23 MED ORDER — FLUTICASONE PROPIONATE 50 MCG/ACT NA SUSP: 2.0000 | Freq: Every day | NASAL | 0 refills | Status: DC

## 2019-03-23 MED ORDER — AEROCHAMBER PLUS MISC: 2 refills | Status: DC

## 2019-03-23 MED ORDER — HYDROCOD POLST-CPM POLST ER 10-8 MG/5ML PO SUER: 5.0000 mL | Freq: Two times a day (BID) | ORAL | 0 refills | Status: DC | PRN

## 2019-03-23 NOTE — ED Provider Notes (Signed)
HPI  SUBJECTIVE:  Ethan George is a 44 y.o. male who presents with cough productive of thick yellow-green mucus which is occasionally clear, wheezing, diffuse chest tightness and soreness, shortness of breath, dyspnea on exertion.  He reports mild sinus pain and pressure, postnasal drip, nasal congestion.  He started having symptoms on 1/2, was diagnosed with Covid on 1/5.  Denies facial swelling, upper dental pain, nausea, vomiting, abdominal pain, body aches, headaches, sore throat.  States that his sense of smell has returned and his sense of taste is in the process of returning.  Also reports some diarrhea.  States that he is unable to sleep secondary to the cough. no antibiotics in the past 3 months.  No antipyretic in the past 4 to 6 hours.  He was seen by another urgent care, started on albuterol 2 puffs every 4 hours and prednisone which he finished yesterday.  No alleviating factors.  Symptoms are worse with exertion.  He is a smoker.  He has a history of bronchitis, gout.  No history of pneumonia, diabetes, hypertension, pulmonary disease, coronary disease, chronic kidney disease, immunocompromise, cancer.  PMD: None.    Past Medical History:  Diagnosis Date  . GERD (gastroesophageal reflux disease)   . Gout     Past Surgical History:  Procedure Laterality Date  . BACK SURGERY  2012,2016   X 2    Family History  Problem Relation Age of Onset  . Heart attack Maternal Grandfather     Social History   Tobacco Use  . Smoking status: Current Every Day Smoker    Packs/day: 1.50    Years: 20.00    Pack years: 30.00    Types: Cigarettes  . Smokeless tobacco: Never Used  Substance Use Topics  . Alcohol use: No    Alcohol/week: 0.0 standard drinks  . Drug use: No    No current facility-administered medications for this encounter.  Current Outpatient Medications:  .  albuterol (VENTOLIN HFA) 108 (90 Base) MCG/ACT inhaler, Inhale into the lungs every 6 (six) hours as  needed for wheezing or shortness of breath., Disp: , Rfl:  .  allopurinol (ZYLOPRIM) 100 MG tablet, Take 1 tablet (100 mg total) by mouth daily., Disp: 30 tablet, Rfl: 0 .  colchicine 0.6 MG tablet, Take 1 tablet (0.6 mg total) by mouth 2 (two) times daily. Take q 2 hours with a max. Of 3/daily (Patient taking differently: Take 0.6 mg by mouth daily. Take q 2 hours with a max. Of 3/daily), Disp: 30 tablet, Rfl: 0 .  naproxen (NAPROSYN) 500 MG tablet, Take 1 tablet (500 mg total) by mouth 2 (two) times daily with a meal., Disp: 60 tablet, Rfl: 0 .  benzonatate (TESSALON) 200 MG capsule, Take 1 capsule (200 mg total) by mouth 3 (three) times daily as needed for cough., Disp: 30 capsule, Rfl: 0 .  chlorpheniramine-HYDROcodone (TUSSIONEX PENNKINETIC ER) 10-8 MG/5ML SUER, Take 5 mLs by mouth every 12 (twelve) hours as needed for cough., Disp: 60 mL, Rfl: 0 .  doxycycline (VIBRAMYCIN) 100 MG capsule, Take 1 capsule (100 mg total) by mouth 2 (two) times daily for 5 days., Disp: 10 capsule, Rfl: 0 .  fluticasone (FLONASE) 50 MCG/ACT nasal spray, Place 2 sprays into both nostrils daily., Disp: 16 g, Rfl: 0 .  Spacer/Aero-Holding Chambers (AEROCHAMBER PLUS) inhaler, Use as instructed, Disp: 1 each, Rfl: 2  Allergies  Allergen Reactions  . Tramadol Hives  . Ultram [Tramadol Hcl] Hives     ROS  As noted in HPI.   Physical Exam  BP (!) 141/93 (BP Location: Left Arm)   Pulse 72   Temp 98.3 F (36.8 C) (Oral)   Ht 5\' 9"  (1.753 m)   Wt 95.3 kg   SpO2 97%   BMI 31.01 kg/m   Constitutional: Well developed, well nourished, no acute distress Eyes:  EOMI, conjunctiva normal bilaterally HENT: Normocephalic, atraumatic,mucus membranes moist.  Positive yellowish nasal congestion with erythematous, swollen turbinates.  No maxillary frontal sinus tenderness Respiratory: Normal inspiratory effort, diffuse wheezing and rales throughout all lung fields.  No anterior or lateral chest wall  tenderness Cardiovascular: Normal rate regular rhythm no murmurs rubs or gallops GI: nondistended skin: No rash, skin intact Musculoskeletal: no deformities Neurologic: Alert & oriented x 3, no focal neuro deficits Psychiatric: Speech and behavior appropriate   ED Course   Medications - No data to display  Orders Placed This Encounter  Procedures  . DG Chest 2 View    Standing Status:   Standing    Number of Occurrences:   1    Order Specific Question:   Reason for Exam (SYMPTOM  OR DIAGNOSIS REQUIRED)    Answer:   Covid positive, now with productive cough rule out pneumonia    No results found for this or any previous visit (from the past 24 hour(s)). DG Chest 2 View  Result Date: 03/23/2019 CLINICAL DATA:  Productive cough.  Rule out COVID pneumonia EXAM: CHEST - 2 VIEW COMPARISON:  02/27/2016 FINDINGS: Normal heart size and mediastinal contours. No acute infiltrate or edema. No effusion or pneumothorax. No acute osseous findings. IMPRESSION: Negative chest. Electronically Signed   By: Monte Fantasia M.D.   On: 03/23/2019 09:55    ED Clinical Impression  1. Acute bronchitis due to COVID-19 virus      ED Assessment/Plan  Pt with diffuse wheezing states that his cough was initially nonproductive, has now become productive.  Concern for secondary pneumonia.  Checking chest x-ray.    [place] Narcotic database reviewed for this patient, and feel that the risk/benefit ratio today is favorable for proceeding with a prescription for controlled substance.  No opiate prescriptions since 3/20.  Has tolerated hydrocodone before.  Reviewed imaging independently. No acute infiltrate or edema see radiology report for full details.  Chest x-ray is negative for pneumonia, however I am concerned that he could develop one given his physical exam and history of smoking.  Plan to send home with Mucinex D, Flonase, saline nasal irrigation, spacer for his albuterol inhaler.  2 puffs every 4 hours.   We will send home with Tessalon, Tussionex,  doxycycline 100 mg twice daily for 5 days. Covid work note.  Discussed imaging, MDM, treatment plan, and plan for follow-up with patient. Discussed sn/sx that should prompt return to the ED. patient agrees with plan.   Writing Covid work note.  Meds ordered this encounter  Medications  . doxycycline (VIBRAMYCIN) 100 MG capsule    Sig: Take 1 capsule (100 mg total) by mouth 2 (two) times daily for 5 days.    Dispense:  10 capsule    Refill:  0  . Spacer/Aero-Holding Chambers (AEROCHAMBER PLUS) inhaler    Sig: Use as instructed    Dispense:  1 each    Refill:  2  . benzonatate (TESSALON) 200 MG capsule    Sig: Take 1 capsule (200 mg total) by mouth 3 (three) times daily as needed for cough.    Dispense:  30 capsule  Refill:  0  . chlorpheniramine-HYDROcodone (TUSSIONEX PENNKINETIC ER) 10-8 MG/5ML SUER    Sig: Take 5 mLs by mouth every 12 (twelve) hours as needed for cough.    Dispense:  60 mL    Refill:  0  . fluticasone (FLONASE) 50 MCG/ACT nasal spray    Sig: Place 2 sprays into both nostrils daily.    Dispense:  16 g    Refill:  0    *This clinic note was created using Scientist, clinical (histocompatibility and immunogenetics). Therefore, there may be occasional mistakes despite careful proofreading.   ?   Domenick Gong, MD 03/24/19 1124

## 2019-03-23 NOTE — ED Triage Notes (Signed)
Pt presents with c/o COVID POS 03/17/19, symptom onset 03/14/19. He reports continued productive cough, shob and chest tightness. He has been given oral prednisone and albuterol HFA with little to no improvement. Pt does report some sweats but has not checked his temperature. He denies any n/v/d. He does have loss of taste.

## 2019-03-23 NOTE — Discharge Instructions (Addendum)
Chest x-ray is negative for pneumonia, however I am concerned that he could develop 1 given the way her lung sound and your history of smoking.  I am going to send you home with doxycycline which is an antibiotic.  Also start Mucinex D, Flonase, saline nasal irrigation with a Lloyd Huger med rinse and distilled water as often as you want, spacer for your albuterol inhaler.  2 puffs every 4 hours using your spacer.  Tessalon for the cough during the day, Tussionex for the cough at night.    Here is a list of primary care providers who are taking new patients:  Dr. Elizabeth Sauer, Dr. Schuyler Amor [address] [place] [address] [phone]  [facility] [address] [place] [address]  [phone]  [facility] [address] [place], [place] [address] [phone]  [facility] [address] [place]  [phone] [place], [place] [address]  Here are clinics/ other resources who will see you if you do not have insurance. Some have certain criteria that you must meet. Call them and find out what they are:  [facility]: [address], [place], [place] [address] Phone: [phone] Hours: First and Third Saturdays of each Month, 9 a.m. - 1 p.m.  [facility]: [address], [address] [place], [place] [address] Phone: [phone] Hours: Tuesday, 4 p.m. - 8 p.m. Thursday, 1 p.m. - 8 p.m. Wednesday, 9 a.m. - [facility] [address] [place], [place] [address] Phone: [phone] Pharmacy Phone Number: [phone] Dental Phone Number: [phone] [facility] Insurance Help: [phone]  Dental Hours: Monday - Thursday, 8 a.m. - 6 p.m.  [facility] [address] [place], [place] [address] Phone: [phone] Pharmacy Phone Number: [phone] [facility] Insurance Help: [phone]  [facility] [address] [place] [place], [place] [address] Phone:  [phone] Pharmacy Phone Number: [phone] [facility] Insurance Help: [phone]  [facility] [address] [place], [place] [address] Phone: [phone] [facility] Insurance Help: [phone]   [facility] [address], [place], [place] [address] Phone: [phone]  Go to www.goodrx.com to look up your medications. This will give you a list of where you can find your prescriptions at the most affordable prices. Or ask the pharmacist what the cash price is, or if they have any other discount programs available to help make your medication more affordable. This can be less expensive than what you would pay with insurance.

## 2019-06-02 DIAGNOSIS — M109 Gout, unspecified: Secondary | ICD-10-CM | POA: Insufficient documentation

## 2019-07-07 ENCOUNTER — Other Ambulatory Visit: Payer: Self-pay | Admitting: Orthopedic Surgery

## 2019-07-07 DIAGNOSIS — M2392 Unspecified internal derangement of left knee: Secondary | ICD-10-CM

## 2019-07-07 DIAGNOSIS — M25562 Pain in left knee: Secondary | ICD-10-CM

## 2019-07-16 ENCOUNTER — Other Ambulatory Visit: Payer: Self-pay | Admitting: Orthopedic Surgery

## 2019-07-16 DIAGNOSIS — M25562 Pain in left knee: Secondary | ICD-10-CM

## 2019-07-16 DIAGNOSIS — M2392 Unspecified internal derangement of left knee: Secondary | ICD-10-CM

## 2019-07-17 ENCOUNTER — Ambulatory Visit
Admission: RE | Admit: 2019-07-17 | Discharge: 2019-07-17 | Disposition: A | Discharge status: Home / Self Care | Payer: Managed Care, Other (non HMO) | Source: Ambulatory Visit | Attending: Orthopedic Surgery | Admitting: Orthopedic Surgery

## 2019-07-17 ENCOUNTER — Other Ambulatory Visit: Payer: Self-pay

## 2019-07-17 DIAGNOSIS — M2392 Unspecified internal derangement of left knee: Secondary | ICD-10-CM

## 2019-07-17 DIAGNOSIS — M25562 Pain in left knee: Secondary | ICD-10-CM

## 2019-09-21 ENCOUNTER — Ambulatory Visit
Admission: RE | Admit: 2019-09-21 | Discharge: 2019-09-21 | Disposition: A | Discharge status: Home / Self Care | Payer: Managed Care, Other (non HMO) | Source: Ambulatory Visit | Attending: Orthopedic Surgery | Admitting: Orthopedic Surgery

## 2019-09-21 ENCOUNTER — Other Ambulatory Visit: Payer: Self-pay | Admitting: Orthopedic Surgery

## 2019-09-21 ENCOUNTER — Other Ambulatory Visit: Payer: Self-pay

## 2019-09-21 DIAGNOSIS — S83232A Complex tear of medial meniscus, current injury, left knee, initial encounter: Secondary | ICD-10-CM

## 2019-09-22 ENCOUNTER — Other Ambulatory Visit
Admission: RE | Admit: 2019-09-22 | Discharge: 2019-09-22 | Disposition: A | Discharge status: Home / Self Care | Payer: Managed Care, Other (non HMO) | Source: Ambulatory Visit | Attending: Orthopedic Surgery | Admitting: Orthopedic Surgery

## 2019-09-22 DIAGNOSIS — S83232A Complex tear of medial meniscus, current injury, left knee, initial encounter: Secondary | ICD-10-CM | POA: Diagnosis present

## 2019-09-22 LAB — SYNOVIAL CELL COUNT + DIFF, W/ CRYSTALS
Crystals, Fluid: NONE SEEN
Eosinophils-Synovial: 0 %
Lymphocytes-Synovial Fld: 32 %
Monocyte-Macrophage-Synovial Fluid: 23 %
Neutrophil, Synovial: 45 %
WBC, Synovial: 782 /mm3 — ABNORMAL HIGH (ref 0–200)

## 2019-09-23 ENCOUNTER — Other Ambulatory Visit: Payer: Self-pay | Admitting: Orthopedic Surgery

## 2019-09-23 ENCOUNTER — Other Ambulatory Visit
Admission: RE | Admit: 2019-09-23 | Discharge: 2019-09-23 | Disposition: A | Discharge status: Home / Self Care | Payer: Managed Care, Other (non HMO) | Source: Ambulatory Visit | Attending: Orthopedic Surgery | Admitting: Orthopedic Surgery

## 2019-09-23 ENCOUNTER — Other Ambulatory Visit: Payer: Self-pay

## 2019-09-23 DIAGNOSIS — Z20822 Contact with and (suspected) exposure to covid-19: Secondary | ICD-10-CM | POA: Insufficient documentation

## 2019-09-23 DIAGNOSIS — Z01812 Encounter for preprocedural laboratory examination: Secondary | ICD-10-CM | POA: Insufficient documentation

## 2019-09-23 LAB — SARS CORONAVIRUS 2 (TAT 6-24 HRS): SARS Coronavirus 2: NEGATIVE

## 2019-09-24 ENCOUNTER — Other Ambulatory Visit: Payer: Self-pay | Admitting: Orthopedic Surgery

## 2019-09-28 ENCOUNTER — Ambulatory Visit: Payer: Managed Care, Other (non HMO) | Admitting: Anesthesiology

## 2019-09-28 ENCOUNTER — Encounter
Admission: RE | Disposition: A | Discharge status: Home / Self Care | Payer: Self-pay | Source: Home / Self Care | Attending: Orthopedic Surgery

## 2019-09-28 ENCOUNTER — Ambulatory Visit
Admission: RE | Admit: 2019-09-28 | Discharge: 2019-09-28 | Disposition: A | Discharge status: Home / Self Care | Payer: Managed Care, Other (non HMO) | Attending: Orthopedic Surgery | Admitting: Orthopedic Surgery

## 2019-09-28 ENCOUNTER — Encounter: Payer: Self-pay | Admitting: Orthopedic Surgery

## 2019-09-28 DIAGNOSIS — Z79899 Other long term (current) drug therapy: Secondary | ICD-10-CM | POA: Insufficient documentation

## 2019-09-28 DIAGNOSIS — M71162 Other infective bursitis, left knee: Secondary | ICD-10-CM | POA: Diagnosis not present

## 2019-09-28 DIAGNOSIS — F17210 Nicotine dependence, cigarettes, uncomplicated: Secondary | ICD-10-CM | POA: Diagnosis not present

## 2019-09-28 DIAGNOSIS — Y939 Activity, unspecified: Secondary | ICD-10-CM | POA: Diagnosis not present

## 2019-09-28 DIAGNOSIS — M109 Gout, unspecified: Secondary | ICD-10-CM | POA: Insufficient documentation

## 2019-09-28 DIAGNOSIS — Z888 Allergy status to other drugs, medicaments and biological substances status: Secondary | ICD-10-CM | POA: Insufficient documentation

## 2019-09-28 DIAGNOSIS — Z419 Encounter for procedure for purposes other than remedying health state, unspecified: Secondary | ICD-10-CM

## 2019-09-28 DIAGNOSIS — K219 Gastro-esophageal reflux disease without esophagitis: Secondary | ICD-10-CM | POA: Insufficient documentation

## 2019-09-28 DIAGNOSIS — G4730 Sleep apnea, unspecified: Secondary | ICD-10-CM | POA: Insufficient documentation

## 2019-09-28 DIAGNOSIS — X58XXXA Exposure to other specified factors, initial encounter: Secondary | ICD-10-CM | POA: Diagnosis not present

## 2019-09-28 DIAGNOSIS — M84462A Pathological fracture, left tibia, initial encounter for fracture: Secondary | ICD-10-CM | POA: Diagnosis not present

## 2019-09-28 DIAGNOSIS — S83242A Other tear of medial meniscus, current injury, left knee, initial encounter: Secondary | ICD-10-CM | POA: Insufficient documentation

## 2019-09-28 HISTORY — PX: KNEE ARTHROSCOPY WITH SUBCHONDROPLASTY: SHX6732

## 2019-09-28 SURGERY — ARTHROSCOPY, KNEE, WITH SUBCHONDROPLASTY
Anesthesia: General | Site: Knee | Laterality: Left

## 2019-09-28 MED ORDER — ONDANSETRON 4 MG PO TBDP
4.0000 mg | ORAL_TABLET | Freq: Three times a day (TID) | ORAL | 0 refills | Status: DC | PRN
Start: 2019-09-28 — End: 2021-01-18

## 2019-09-28 MED ORDER — IBUPROFEN 800 MG PO TABS
800.0000 mg | ORAL_TABLET | Freq: Three times a day (TID) | ORAL | 1 refills | Status: AC
Start: 2019-09-28 — End: 2019-10-12

## 2019-09-28 MED ORDER — ACETAMINOPHEN 10 MG/ML IV SOLN
INTRAVENOUS | Status: AC
  Filled 2019-09-28: qty 100

## 2019-09-28 MED ORDER — EPINEPHRINE PF 1 MG/ML IJ SOLN
INTRAMUSCULAR | Status: AC
  Filled 2019-09-28: qty 1

## 2019-09-28 MED ORDER — CEFAZOLIN SODIUM-DEXTROSE 2-4 GM/100ML-% IV SOLN: 2.0000 g | INTRAVENOUS | Status: DC

## 2019-09-28 MED ORDER — DEXMEDETOMIDINE HCL 200 MCG/2ML IV SOLN
INTRAVENOUS | Status: DC | PRN
  Administered 2019-09-28: 12 ug via INTRAVENOUS

## 2019-09-28 MED ORDER — FENTANYL CITRATE (PF) 250 MCG/5ML IJ SOLN
INTRAMUSCULAR | Status: AC
  Filled 2019-09-28: qty 5

## 2019-09-28 MED ORDER — LACTATED RINGERS IV SOLN: INTRAVENOUS | Status: DC

## 2019-09-28 MED ORDER — HYDROCODONE-ACETAMINOPHEN 5-325 MG PO TABS: 1.0000 | ORAL_TABLET | ORAL | 0 refills | Status: DC | PRN

## 2019-09-28 MED ORDER — ASPIRIN EC 325 MG PO TBEC
325.0000 mg | DELAYED_RELEASE_TABLET | Freq: Every day | ORAL | 0 refills | Status: AC
Start: 2019-09-28 — End: 2019-10-12

## 2019-09-28 MED ORDER — OXYCODONE HCL 5 MG/5ML PO SOLN: 5.0000 mg | Freq: Once | ORAL | Status: AC | PRN

## 2019-09-28 MED ORDER — ROCURONIUM BROMIDE 10 MG/ML (PF) SYRINGE
PREFILLED_SYRINGE | INTRAVENOUS | Status: AC
  Filled 2019-09-28: qty 10

## 2019-09-28 MED ORDER — CHLORHEXIDINE GLUCONATE 0.12 % MT SOLN: 15.0000 mL | Freq: Once | OROMUCOSAL | Status: AC

## 2019-09-28 MED ORDER — DEXMEDETOMIDINE HCL IN NACL 80 MCG/20ML IV SOLN
INTRAVENOUS | Status: AC
  Filled 2019-09-28: qty 20

## 2019-09-28 MED ORDER — OXYCODONE HCL 5 MG PO TABS: 5.0000 mg | ORAL_TABLET | Freq: Once | ORAL | Status: AC | PRN

## 2019-09-28 MED ORDER — CHLORHEXIDINE GLUCONATE 0.12 % MT SOLN
OROMUCOSAL | Status: AC
  Administered 2019-09-28: 15 mL via OROMUCOSAL
  Filled 2019-09-28: qty 15

## 2019-09-28 MED ORDER — SUGAMMADEX SODIUM 200 MG/2ML IV SOLN
INTRAVENOUS | Status: DC | PRN
Start: 2019-09-28 — End: 2019-09-28
  Administered 2019-09-28: 200 mg via INTRAVENOUS

## 2019-09-28 MED ORDER — FENTANYL CITRATE (PF) 100 MCG/2ML IJ SOLN
INTRAMUSCULAR | Status: AC
  Administered 2019-09-28: 25 ug via INTRAVENOUS
  Filled 2019-09-28: qty 2

## 2019-09-28 MED ORDER — BUPIVACAINE HCL (PF) 0.5 % IJ SOLN
INTRAMUSCULAR | Status: AC
  Filled 2019-09-28: qty 30

## 2019-09-28 MED ORDER — ROCURONIUM BROMIDE 100 MG/10ML IV SOLN
INTRAVENOUS | Status: DC | PRN
  Administered 2019-09-28: 20 mg via INTRAVENOUS

## 2019-09-28 MED ORDER — SUCCINYLCHOLINE CHLORIDE 200 MG/10ML IV SOSY
PREFILLED_SYRINGE | INTRAVENOUS | Status: AC
  Filled 2019-09-28: qty 10

## 2019-09-28 MED ORDER — LIDOCAINE HCL (CARDIAC) PF 100 MG/5ML IV SOSY
PREFILLED_SYRINGE | INTRAVENOUS | Status: DC | PRN
  Administered 2019-09-28: 100 mg via INTRAVENOUS

## 2019-09-28 MED ORDER — FENTANYL CITRATE (PF) 100 MCG/2ML IJ SOLN
25.0000 ug | INTRAMUSCULAR | Status: AC | PRN
  Administered 2019-09-28 (×4): 25 ug via INTRAVENOUS

## 2019-09-28 MED ORDER — MIDAZOLAM HCL 2 MG/2ML IJ SOLN
INTRAMUSCULAR | Status: AC
  Filled 2019-09-28: qty 2

## 2019-09-28 MED ORDER — HYDROMORPHONE HCL 1 MG/ML IJ SOLN
0.5000 mg | INTRAMUSCULAR | Status: DC | PRN
  Administered 2019-09-28: 0.5 mg via INTRAVENOUS

## 2019-09-28 MED ORDER — LIDOCAINE-EPINEPHRINE 1 %-1:100000 IJ SOLN
INTRAMUSCULAR | Status: AC
  Filled 2019-09-28: qty 1

## 2019-09-28 MED ORDER — OXYCODONE HCL 5 MG PO TABS
5.0000 mg | ORAL_TABLET | Freq: Once | ORAL | Status: AC
  Administered 2019-09-28: 5 mg via ORAL

## 2019-09-28 MED ORDER — ACETAMINOPHEN 10 MG/ML IV SOLN
INTRAVENOUS | Status: DC | PRN
  Administered 2019-09-28: 1000 mg via INTRAVENOUS

## 2019-09-28 MED ORDER — CEFAZOLIN SODIUM-DEXTROSE 2-4 GM/100ML-% IV SOLN
INTRAVENOUS | Status: AC
  Filled 2019-09-28: qty 100

## 2019-09-28 MED ORDER — LIDOCAINE HCL (PF) 2 % IJ SOLN
INTRAMUSCULAR | Status: AC
  Filled 2019-09-28: qty 5

## 2019-09-28 MED ORDER — ONDANSETRON HCL 4 MG/2ML IJ SOLN: 4.0000 mg | Freq: Once | INTRAMUSCULAR | Status: DC | PRN

## 2019-09-28 MED ORDER — ONDANSETRON HCL 4 MG/2ML IJ SOLN
INTRAMUSCULAR | Status: AC
  Filled 2019-09-28: qty 2

## 2019-09-28 MED ORDER — ACETAMINOPHEN 500 MG PO TABS
1000.0000 mg | ORAL_TABLET | Freq: Three times a day (TID) | ORAL | 2 refills | Status: AC
Start: 2019-09-28 — End: 2020-09-27

## 2019-09-28 MED ORDER — SULFAMETHOXAZOLE-TRIMETHOPRIM 800-160 MG PO TABS: 1.0000 | ORAL_TABLET | Freq: Two times a day (BID) | ORAL | 0 refills | Status: AC

## 2019-09-28 MED ORDER — PROPOFOL 10 MG/ML IV BOLUS
INTRAVENOUS | Status: AC
  Filled 2019-09-28: qty 20

## 2019-09-28 MED ORDER — OXYCODONE HCL 5 MG PO TABS
ORAL_TABLET | ORAL | Status: AC
  Administered 2019-09-28: 5 mg via ORAL
  Filled 2019-09-28: qty 1

## 2019-09-28 MED ORDER — HYDROMORPHONE HCL 1 MG/ML IJ SOLN
INTRAMUSCULAR | Status: AC
  Administered 2019-09-28: 0.5 mg via INTRAVENOUS
  Filled 2019-09-28: qty 1

## 2019-09-28 MED ORDER — PROPOFOL 10 MG/ML IV BOLUS
INTRAVENOUS | Status: DC | PRN
  Administered 2019-09-28: 180 mg via INTRAVENOUS

## 2019-09-28 MED ORDER — DEXAMETHASONE SODIUM PHOSPHATE 10 MG/ML IJ SOLN
INTRAMUSCULAR | Status: AC
  Filled 2019-09-28: qty 1

## 2019-09-28 MED ORDER — FENTANYL CITRATE (PF) 100 MCG/2ML IJ SOLN
INTRAMUSCULAR | Status: DC | PRN
  Administered 2019-09-28 (×4): 50 ug via INTRAVENOUS

## 2019-09-28 MED ORDER — MIDAZOLAM HCL 2 MG/2ML IJ SOLN
INTRAMUSCULAR | Status: DC | PRN
  Administered 2019-09-28: 2 mg via INTRAVENOUS

## 2019-09-28 MED ORDER — OXYCODONE HCL 5 MG PO TABS
ORAL_TABLET | ORAL | Status: AC
  Filled 2019-09-28: qty 1

## 2019-09-28 MED ORDER — CEFAZOLIN SODIUM-DEXTROSE 2-4 GM/100ML-% IV SOLN
2.0000 g | INTRAVENOUS | Status: AC
  Administered 2019-09-28: 2 g via INTRAVENOUS

## 2019-09-28 MED ORDER — ONDANSETRON HCL 4 MG/2ML IJ SOLN
INTRAMUSCULAR | Status: DC | PRN
  Administered 2019-09-28: 4 mg via INTRAVENOUS

## 2019-09-28 MED ORDER — SUCCINYLCHOLINE CHLORIDE 20 MG/ML IJ SOLN
INTRAMUSCULAR | Status: DC | PRN
  Administered 2019-09-28: 120 mg via INTRAVENOUS

## 2019-09-28 MED ORDER — DEXAMETHASONE SODIUM PHOSPHATE 10 MG/ML IJ SOLN
INTRAMUSCULAR | Status: DC | PRN
  Administered 2019-09-28: 10 mg via INTRAVENOUS

## 2019-09-28 MED ORDER — ORAL CARE MOUTH RINSE: 15.0000 mL | Freq: Once | OROMUCOSAL | Status: AC

## 2019-09-28 SURGICAL SUPPLY — 51 items
"PENCIL ELECTRO HAND CTR " (MISCELLANEOUS) IMPLANT
ADAPTER IRRIG TUBE 2 SPIKE SOL (ADAPTER) ×6 IMPLANT
BLADE SURG SZ11 CARB STEEL (BLADE) ×3 IMPLANT
BNDG COHESIVE 6X5 TAN STRL LF (GAUZE/BANDAGES/DRESSINGS) ×3 IMPLANT
BNDG ELASTIC 6X5.8 VLCR STR LF (GAUZE/BANDAGES/DRESSINGS) ×3 IMPLANT
BNDG ESMARK 6X12 TAN STRL LF (GAUZE/BANDAGES/DRESSINGS) ×3 IMPLANT
BUR RADIUS 3.5 (BURR) IMPLANT
BUR RADIUS 4.0X18.5 (BURR) IMPLANT
CAST PADDING 6X4YD ST 30248 (SOFTGOODS) ×2
CHLORAPREP W/TINT 26 (MISCELLANEOUS) ×3 IMPLANT
COOLER POLAR GLACIER W/PUMP (MISCELLANEOUS) ×3 IMPLANT
COVER WAND RF STERILE (DRAPES) ×1 IMPLANT
CUFF TOURN SGL QUICK 24 (TOURNIQUET CUFF)
CUFF TOURN SGL QUICK 30 (TOURNIQUET CUFF)
CUFF TRNQT CYL 24X4X16.5-23 (TOURNIQUET CUFF) IMPLANT
CUFF TRNQT CYL 30X4X21-28X (TOURNIQUET CUFF) IMPLANT
DEVICE SUCT BLK HOLE OR FLOOR (MISCELLANEOUS) ×1 IMPLANT
DRAIN PENROSE 1/4X12 LTX STRL (WOUND CARE) ×2 IMPLANT
DRAPE IMP U-DRAPE 54X76 (DRAPES) ×3 IMPLANT
ELECT REM PT RETURN 9FT ADLT (ELECTROSURGICAL) ×3
ELECTRODE REM PT RTRN 9FT ADLT (ELECTROSURGICAL) IMPLANT
GAUZE SPONGE 4X4 12PLY STRL (GAUZE/BANDAGES/DRESSINGS) ×3 IMPLANT
GLOVE BIOGEL PI IND STRL 8 (GLOVE) ×1 IMPLANT
GLOVE BIOGEL PI INDICATOR 8 (GLOVE) ×2
GLOVE SURG ORTHO 8.0 STRL STRW (GLOVE) ×6 IMPLANT
GOWN STRL REUS W/ TWL LRG LVL3 (GOWN DISPOSABLE) ×1 IMPLANT
GOWN STRL REUS W/ TWL XL LVL3 (GOWN DISPOSABLE) ×1 IMPLANT
GOWN STRL REUS W/TWL LRG LVL3 (GOWN DISPOSABLE) ×2
GOWN STRL REUS W/TWL XL LVL3 (GOWN DISPOSABLE) ×2
HANDLE YANKAUER SUCT BULB TIP (MISCELLANEOUS) ×2 IMPLANT
IV LACTATED RINGER IRRG 3000ML (IV SOLUTION)
IV LR IRRIG 3000ML ARTHROMATIC (IV SOLUTION) ×4 IMPLANT
KIT TURNOVER KIT A (KITS) ×3 IMPLANT
MANIFOLD NEPTUNE II (INSTRUMENTS) ×3 IMPLANT
MAT ABSORB  FLUID 56X50 GRAY (MISCELLANEOUS) ×2
MAT ABSORB FLUID 56X50 GRAY (MISCELLANEOUS) ×2 IMPLANT
PACK KNEE ARTHRO (MISCELLANEOUS) ×3 IMPLANT
PAD ABD DERMACEA PRESS 5X9 (GAUZE/BANDAGES/DRESSINGS) ×6 IMPLANT
PAD WRAPON POLAR KNEE (MISCELLANEOUS) ×1 IMPLANT
PADDING CAST COTTON 6X4 ST (SOFTGOODS) ×1 IMPLANT
PDP PLUS ×2 IMPLANT
PENCIL ELECTRO HAND CTR (MISCELLANEOUS) ×3 IMPLANT
SET TUBE SUCT SHAVER OUTFL 24K (TUBING) ×1 IMPLANT
SET TUBE TIP INTRA-ARTICULAR (MISCELLANEOUS) ×1 IMPLANT
SUT ETHILON 3-0 FS-10 30 BLK (SUTURE) ×3
SUT PDS 2-0 27IN (SUTURE) ×2 IMPLANT
SUTURE EHLN 3-0 FS-10 30 BLK (SUTURE) ×1 IMPLANT
TOWEL OR 17X26 4PK STRL BLUE (TOWEL DISPOSABLE) ×6 IMPLANT
TUBING ARTHRO INFLOW-ONLY STRL (TUBING) ×1 IMPLANT
WAND WEREWOLF FLOW 90D (MISCELLANEOUS) IMPLANT
WRAPON POLAR PAD KNEE (MISCELLANEOUS) ×3

## 2019-09-28 NOTE — H&P (Signed)
Paper H&P to be scanned into permanent record. H&P reviewed. Discussed with patient that may perform only I&D if there was return of purulent material from infrapatellar bursa in order to avoid seeding the knee joint itself with infection. If there is no sign of infection, we would proceed with knee arthroscopy, partial medial meniscectomy, and subchondroplasty.

## 2019-09-28 NOTE — Transfer of Care (Signed)
Immediate Anesthesia Transfer of Care Note  Patient: Ethan George  Procedure(s) Performed: Left Knee open irrigation and debridement of the infrapatellar bursa. (Left Knee)  Patient Location: PACU  Anesthesia Type:General  Level of Consciousness: awake and drowsy  Airway & Oxygen Therapy: Patient Spontanous Breathing and Patient connected to face mask oxygen  Post-op Assessment: Report given to RN and Post -op Vital signs reviewed and stable  Post vital signs: Reviewed and stable  Last Vitals:  Vitals Value Taken Time  BP 134/76 09/28/19 1610  Temp 36.1 C 09/28/19 1610  Pulse 69 09/28/19 1615  Resp 22 09/28/19 1615  SpO2 96 % 09/28/19 1615    Last Pain:  Vitals:   09/28/19 1406  PainSc: 7          Complications: No complications documented.

## 2019-09-28 NOTE — Op Note (Signed)
Operative Note    SURGERY DATE: 09/28/2019   PRE-OP DIAGNOSIS:  1. Left knee medial meniscus tear 2. Left knee subchondral insufficiency fracture 3. Left knee infrapatellar bursitis vs. infection   POST-OP DIAGNOSIS:  1. Left knee medial meniscus tear 2. Left knee subchondral insufficiency fracture 3. Left knee infrapatellar bursa infection   PROCEDURES:  1. Left knee irrigation and debridement of infrapatellar bursa of the knee   SURGEON: Rosealee Albee, MD  ASSISTANT: Haywood Lasso, PA-S   ANESTHESIA: Gen    ESTIMATED BLOOD LOSS: 10cc   TOTAL IV FLUIDS: per anesthesia  DRAIN: none   INDICATION(S):  Ethan George is a 44 y.o. 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  Pain is located over the anterior aspect of the knee over the patellar tendon and the patient had severe pain with attempted knee flexion beyond 30 degrees.  Knee joint aspiration was negative for septic arthritis and crystalline arthropathy.  Repeat MRI showed stable medial meniscus tear and bone marrow edema of the medial tibial plateau, but did show significantly increased infrapatellar bursa fluid. After discussion of risks, benefits, and alternatives to surgery, the patient elected to proceed.  He did understand that if his infrapatellar bursa was truly infected, we would not proceed with knee arthroscopy, medial meniscectomy, and subchondroplasty.  That would likely have to be performed in a staged fashion once the infection was cleared.   OPERATIVE FINDINGS: Significant purulent fluid from the infrapatellar bursa and gross purulence in this region   OPERATIVE REPORT:   I identified Ethan George in the pre-operative holding area. Informed consent was obtained and the surgical site was marked. I reviewed the risks and benefits of the proposed  surgical intervention and the patient wished to proceed. The patient was transferred to the operative suite and general anesthesia was administered. The patient was placed in the supine position. All down side pressure points were appropriately padded. The extremity was then prepped and draped in standard fashion. A time out was performed confirming the correct extremity, correct patient, and correct procedure.  Preoperative antibiotics were held until culture could be obtained.  An approximately 3 cm longitudinal incision was made over the lateral border of the patellar tendon.  Dissection was carried down to the patellar tendon itself.  The lateral border was identified.  Using the Metzenbaum scissors, the infrapatellar bursa region was entered.  There was immediate return of grossly purulent fluid.  Culture samples x2 were taken.  Appropriate IV antibiotics were then administered.  There were significant solid purulent material in the infrapatellar region that was removed.  A rongeur was used to remove all grossly infected tissue as well as loose, devitalized tissue and fat pad.  Next, a curette was used to debride the posterior aspect of the patellar tendon, the anterior cortex of the proximal tibia, and the fat pad.  There was no extension of grossly purulent material beyond the infrapatellar space.  The wound was thoroughly irrigated with bulb syringe.  A Penrose drain was placed into the infrapatellar space.  2-0 PDS sutures were used to close the region around the Penrose drain superficial to the patellar tendon.  Further 2-0 PDS sutures were used to close the dermal layer around the Penrose drain.  3-0 nylon sutures were used to close the skin.  He was then awakened  from anesthesia, and he was then transported to the PACU without further complication.   POSTOPERATIVE PLAN: The patient will be discharged home on p.o. antibiotics.  Aspirin 325 mg/day x2 weeks for DVT prophylaxis. The patient will  follow-up in 2 days for drain removal.

## 2019-09-28 NOTE — Anesthesia Postprocedure Evaluation (Signed)
Anesthesia Post Note  Patient: Ethan George  Procedure(s) Performed: Left Knee open irrigation and debridement of the infrapatellar bursa. (Left Knee)  Patient location during evaluation: PACU Anesthesia Type: General Level of consciousness: awake and alert Pain management: pain level controlled Vital Signs Assessment: post-procedure vital signs reviewed and stable Respiratory status: spontaneous breathing, nonlabored ventilation, respiratory function stable and patient connected to nasal cannula oxygen Cardiovascular status: blood pressure returned to baseline and stable Postop Assessment: no apparent nausea or vomiting Anesthetic complications: no   No complications documented.   Last Vitals:  Vitals:   09/28/19 1640 09/28/19 1655  BP: (!) 146/96 (!) 147/99  Pulse: 66 69  Resp: 12 13  Temp:    SpO2: 94% 94%    Last Pain:  Vitals:   09/28/19 1655  PainSc: 7                  Yevette Edwards

## 2019-09-28 NOTE — Discharge Instructions (Addendum)
Knee Surgery   Post-Op Instructions   1. Bracing or crutches: Crutches will be provided at the time of discharge from the surgery center if you do not already have them.   2. Ice: You may be provided with a device [facility]) that allows you to ice the affected area effectively. Otherwise you can ice manually.    3. Driving:  Plan on not driving for at least one week. Please note that you are advised NOT to drive while taking narcotic pain medications as you may be impaired and unsafe to drive.   4. Activity: Ankle pumps several times an hour while awake to prevent blood clots. Weight bearing: as tolerated. Use crutches for as needed (usually ~3 days or less) until pain allows you to ambulate without a limp. Keep knee mostly extended (straight) until first follow up appt in 2 weeks. Elevate knee above heart level as much as possible for one week. Avoid standing more than 5 minutes (consecutively) for the first week.  Avoid long distance travel for 2 weeks.   5. Medications:  - Take Bactrim DS (antiobiotic) twice daily for 14 days (finish entire course of 28 tablets even if not having significant pain). Take first dose the evening after surgery. - You have been provided a prescription for narcotic pain medicine. After surgery, take 1-2 narcotic tablets every 4 hours if needed for severe pain.  - You may take up to 3000mg /day of tylenol (acetaminophen). You can take 1000mg  3x/day. Please check your narcotic. If you have acetaminophen in your narcotic (each tablet will be 325mg ), be careful not to exceed a total of 3000mg /day of acetaminophen.  - A prescription for anti-nausea medication will be provided in case the narcotic medicine causes nausea - take 1 tablet every 6 hours only if nauseated.  - Take ibuprofen 800 mg every 8 hours WITH food to reduce post-operative knee swelling and pain - Take enteric coated aspirin 325 mg once daily for 2 weeks to prevent blood clots.   6. Bandages: Maintain  bandages until postoperative appointment. Do not get incision wet for at least 2 weeks until follow up visit with Dr. .    7. Physical Therapy: 1-2 times per week for 6 weeks. Therapy typically starts on post operative Day 3 or 4. You have been provided an order for physical therapy. The therapist will provide home exercises.    8. Post-Op Appointments: Your first post-op appointment will be with , PA in 2 days (09/30/19 @1030am  in [facility] office) to remove drain. Follow up with Dr. Allena Katz in approximately 2 weeks time. For stitch removal.    If you find that they have not been scheduled please call the Orthopaedic Appointment front desk at [phone].  AMBULATORY SURGERY  DISCHARGE INSTRUCTIONS   1) The drugs that you were given will stay in your system until tomorrow so for the next 24 hours you should not:  A) Drive an automobile B) Make any legal decisions C) Drink any alcoholic beverage   2) You may resume regular meals tomorrow.  Today it is better to start with liquids and gradually work up to solid foods.  You may eat anything you prefer, but it is better to start with liquids, then soup and crackers, and gradually work up to solid foods.   3) Please notify your doctor immediately if you have any unusual bleeding, trouble breathing, redness and pain at the surgery site, drainage, fever, or pain not relieved by medication.  4) Additional Instructions:        Please contact your physician with any problems or Same Day Surgery at [phone], Monday through Friday 6 am to 4 pm, or [place] at [facility] number at [phone].

## 2019-09-28 NOTE — Anesthesia Preprocedure Evaluation (Addendum)
Anesthesia Evaluation  Patient identified by MRN, date of birth, ID band Patient awake    Reviewed: Allergy & Precautions, H&P , NPO status , Patient's Chart, lab work & pertinent test results  Airway Mallampati: II  TM Distance: >3 FB Neck ROM: full    Dental  (+) Teeth Intact   Pulmonary sleep apnea (suspected, undiagnosed sleep apnea) , Current Smoker,    breath sounds clear to auscultation       Cardiovascular (-) angina(-) Past MI and (-) Cardiac Stents negative cardio ROS  (-) dysrhythmias  Rhythm:regular Rate:Normal     Neuro/Psych negative neurological ROS  negative psych ROS   GI/Hepatic Neg liver ROS, GERD  Controlled,  Endo/Other  negative endocrine ROS  Renal/GU      Musculoskeletal   Abdominal   Peds  Hematology negative hematology ROS (+)   Anesthesia Other Findings Past Medical History: No date: GERD (gastroesophageal reflux disease) No date: Gout  Past Surgical History: 2012,2016: BACK SURGERY     Comment:  X 2     Reproductive/Obstetrics negative OB ROS                           Anesthesia Physical Anesthesia Plan  ASA: II  Anesthesia Plan: General LMA   Post-op Pain Management:    Induction:   PONV Risk Score and Plan: Dexamethasone, Ondansetron, Midazolam and Treatment may vary due to age or medical condition  Airway Management Planned:   Additional Equipment:   Intra-op Plan:   Post-operative Plan:   Informed Consent: I have reviewed the patients History and Physical, chart, labs and discussed the procedure including the risks, benefits and alternatives for the proposed anesthesia with the patient or authorized representative who has indicated his/her understanding and acceptance.     Dental Advisory Given  Plan Discussed with: Anesthesiologist, CRNA and Surgeon  Anesthesia Plan Comments:         Anesthesia Quick Evaluation

## 2019-09-28 NOTE — Anesthesia Procedure Notes (Signed)
Procedure Name: Intubation Date/Time: 09/28/2019 3:02 PM Performed by: Ginger Carne, CRNA Pre-anesthesia Checklist: Patient identified, Emergency Drugs available, Suction available, Patient being monitored and Timeout performed Patient Re-evaluated:Patient Re-evaluated prior to induction Oxygen Delivery Method: Circle system utilized Preoxygenation: Pre-oxygenation with 100% oxygen Induction Type: IV induction Ventilation: Oral airway inserted - appropriate to patient size and Mask ventilation without difficulty Laryngoscope Size: McGraph and 3 Grade View: Grade I Tube type: Oral Tube size: 7.5 mm Number of attempts: 1 Airway Equipment and Method: Stylet,  Video-laryngoscopy and Oral airway Placement Confirmation: ETT inserted through vocal cords under direct vision,  positive ETCO2 and breath sounds checked- equal and bilateral Secured at: 21 cm Dental Injury: Teeth and Oropharynx as per pre-operative assessment

## 2019-09-29 ENCOUNTER — Encounter: Payer: Self-pay | Admitting: Orthopedic Surgery

## 2019-10-03 LAB — AEROBIC/ANAEROBIC CULTURE W GRAM STAIN (SURGICAL/DEEP WOUND)
Culture: NO GROWTH
Culture: NO GROWTH

## 2019-10-22 ENCOUNTER — Other Ambulatory Visit: Payer: Self-pay | Admitting: Orthopedic Surgery

## 2019-10-23 ENCOUNTER — Encounter: Payer: Self-pay | Admitting: Urgent Care

## 2019-10-23 ENCOUNTER — Other Ambulatory Visit: Payer: Self-pay

## 2019-10-23 ENCOUNTER — Other Ambulatory Visit: Admission: RE | Admit: 2019-10-23 | Payer: Managed Care, Other (non HMO) | Source: Ambulatory Visit

## 2019-10-23 ENCOUNTER — Other Ambulatory Visit
Admission: RE | Admit: 2019-10-23 | Discharge: 2019-10-23 | Disposition: A | Discharge status: Home / Self Care | Payer: Managed Care, Other (non HMO) | Source: Ambulatory Visit | Attending: Orthopedic Surgery | Admitting: Orthopedic Surgery

## 2019-10-23 DIAGNOSIS — Z20822 Contact with and (suspected) exposure to covid-19: Secondary | ICD-10-CM | POA: Diagnosis not present

## 2019-10-23 DIAGNOSIS — Z01818 Encounter for other preprocedural examination: Secondary | ICD-10-CM | POA: Diagnosis present

## 2019-10-24 LAB — SARS CORONAVIRUS 2 (TAT 6-24 HRS): SARS Coronavirus 2: NEGATIVE

## 2019-10-26 ENCOUNTER — Other Ambulatory Visit
Admission: RE | Admit: 2019-10-26 | Discharge: 2019-10-26 | Disposition: A | Discharge status: Home / Self Care | Payer: Managed Care, Other (non HMO) | Source: Ambulatory Visit | Attending: Sports Medicine | Admitting: Sports Medicine

## 2019-10-26 DIAGNOSIS — L03116 Cellulitis of left lower limb: Secondary | ICD-10-CM | POA: Diagnosis not present

## 2019-10-26 DIAGNOSIS — M25462 Effusion, left knee: Secondary | ICD-10-CM | POA: Diagnosis not present

## 2019-10-26 DIAGNOSIS — M25562 Pain in left knee: Secondary | ICD-10-CM | POA: Insufficient documentation

## 2019-10-26 LAB — SYNOVIAL CELL COUNT + DIFF, W/ CRYSTALS
Eosinophils-Synovial: 0 %
Lymphocytes-Synovial Fld: 4 %
Monocyte-Macrophage-Synovial Fluid: 26 %
Neutrophil, Synovial: 70 %
WBC, Synovial: 1302 /mm3 — ABNORMAL HIGH (ref 0–200)

## 2019-10-27 ENCOUNTER — Inpatient Hospital Stay: Payer: Managed Care, Other (non HMO) | Admitting: Certified Registered"

## 2019-10-27 ENCOUNTER — Other Ambulatory Visit: Payer: Self-pay

## 2019-10-27 ENCOUNTER — Ambulatory Visit
Admission: RE | Admit: 2019-10-27 | Discharge: 2019-10-27 | Disposition: A | Discharge status: Home / Self Care | Payer: Managed Care, Other (non HMO) | Source: Ambulatory Visit | Attending: Orthopedic Surgery | Admitting: Orthopedic Surgery

## 2019-10-27 ENCOUNTER — Encounter: Payer: Self-pay | Admitting: Orthopedic Surgery

## 2019-10-27 ENCOUNTER — Encounter
Admission: RE | Disposition: A | Discharge status: Home / Self Care | Payer: Self-pay | Source: Ambulatory Visit | Attending: Orthopedic Surgery

## 2019-10-27 DIAGNOSIS — M109 Gout, unspecified: Secondary | ICD-10-CM | POA: Diagnosis not present

## 2019-10-27 DIAGNOSIS — M71162 Other infective bursitis, left knee: Secondary | ICD-10-CM | POA: Diagnosis not present

## 2019-10-27 DIAGNOSIS — F17210 Nicotine dependence, cigarettes, uncomplicated: Secondary | ICD-10-CM | POA: Diagnosis not present

## 2019-10-27 DIAGNOSIS — Z79899 Other long term (current) drug therapy: Secondary | ICD-10-CM | POA: Diagnosis not present

## 2019-10-27 DIAGNOSIS — Z791 Long term (current) use of non-steroidal anti-inflammatories (NSAID): Secondary | ICD-10-CM | POA: Insufficient documentation

## 2019-10-27 DIAGNOSIS — K219 Gastro-esophageal reflux disease without esophagitis: Secondary | ICD-10-CM | POA: Diagnosis not present

## 2019-10-27 HISTORY — PX: KNEE ARTHROSCOPY: SHX127

## 2019-10-27 HISTORY — PX: I & D EXTREMITY: SHX5045

## 2019-10-27 SURGERY — IRRIGATION AND DEBRIDEMENT EXTREMITY
Anesthesia: Choice | Site: Knee | Laterality: Left

## 2019-10-27 MED ORDER — FENTANYL CITRATE (PF) 100 MCG/2ML IJ SOLN
INTRAMUSCULAR | Status: AC
  Filled 2019-10-27: qty 2

## 2019-10-27 MED ORDER — ONDANSETRON HCL 4 MG/2ML IJ SOLN
INTRAMUSCULAR | Status: DC | PRN
  Administered 2019-10-27: 4 mg via INTRAVENOUS

## 2019-10-27 MED ORDER — FENTANYL CITRATE (PF) 100 MCG/2ML IJ SOLN
INTRAMUSCULAR | Status: AC
  Administered 2019-10-27: 25 ug via INTRAVENOUS
  Filled 2019-10-27: qty 2

## 2019-10-27 MED ORDER — ROCURONIUM BROMIDE 10 MG/ML (PF) SYRINGE
PREFILLED_SYRINGE | INTRAVENOUS | Status: AC
  Filled 2019-10-27: qty 20

## 2019-10-27 MED ORDER — ALBUTEROL SULFATE HFA 108 (90 BASE) MCG/ACT IN AERS
INHALATION_SPRAY | RESPIRATORY_TRACT | Status: DC | PRN
  Administered 2019-10-27 (×2): 4 via RESPIRATORY_TRACT

## 2019-10-27 MED ORDER — ASPIRIN EC 325 MG PO TBEC
325.0000 mg | DELAYED_RELEASE_TABLET | Freq: Every day | ORAL | 0 refills | Status: AC
Start: 2019-10-27 — End: 2019-11-10

## 2019-10-27 MED ORDER — DEXMEDETOMIDINE HCL IN NACL 200 MCG/50ML IV SOLN
INTRAVENOUS | Status: DC | PRN
  Administered 2019-10-27: 12 ug via INTRAVENOUS

## 2019-10-27 MED ORDER — FENTANYL CITRATE (PF) 100 MCG/2ML IJ SOLN
INTRAMUSCULAR | Status: DC | PRN
  Administered 2019-10-27 (×4): 50 ug via INTRAVENOUS

## 2019-10-27 MED ORDER — FENTANYL CITRATE (PF) 250 MCG/5ML IJ SOLN
INTRAMUSCULAR | Status: AC
  Filled 2019-10-27: qty 5

## 2019-10-27 MED ORDER — GLYCOPYRROLATE 0.2 MG/ML IJ SOLN
INTRAMUSCULAR | Status: DC | PRN
  Administered 2019-10-27: .2 mg via INTRAVENOUS

## 2019-10-27 MED ORDER — HYDROCODONE-ACETAMINOPHEN 5-325 MG PO TABS: 1.0000 | ORAL_TABLET | ORAL | 0 refills | Status: DC | PRN

## 2019-10-27 MED ORDER — LIDOCAINE HCL (CARDIAC) PF 100 MG/5ML IV SOSY
PREFILLED_SYRINGE | INTRAVENOUS | Status: DC | PRN
  Administered 2019-10-27: 100 mg via INTRAVENOUS

## 2019-10-27 MED ORDER — LIDOCAINE-EPINEPHRINE 1 %-1:100000 IJ SOLN
INTRAMUSCULAR | Status: AC
  Filled 2019-10-27: qty 1

## 2019-10-27 MED ORDER — LEVOFLOXACIN 500 MG PO TABS
500.0000 mg | ORAL_TABLET | Freq: Every day | ORAL | 0 refills | Status: AC
Start: 2019-10-27 — End: 2019-11-10

## 2019-10-27 MED ORDER — LINEZOLID 600 MG PO TABS
600.0000 mg | ORAL_TABLET | Freq: Two times a day (BID) | ORAL | 0 refills | Status: DC
Start: 2019-10-27 — End: 2021-01-18

## 2019-10-27 MED ORDER — ALBUTEROL SULFATE HFA 108 (90 BASE) MCG/ACT IN AERS
INHALATION_SPRAY | RESPIRATORY_TRACT | Status: AC
  Filled 2019-10-27: qty 6.7

## 2019-10-27 MED ORDER — GLYCOPYRROLATE 0.2 MG/ML IJ SOLN
INTRAMUSCULAR | Status: AC
  Filled 2019-10-27: qty 2

## 2019-10-27 MED ORDER — KETOROLAC TROMETHAMINE 30 MG/ML IJ SOLN
INTRAMUSCULAR | Status: AC
  Filled 2019-10-27: qty 1

## 2019-10-27 MED ORDER — FENTANYL CITRATE (PF) 100 MCG/2ML IJ SOLN
25.0000 ug | INTRAMUSCULAR | Status: AC | PRN
  Administered 2019-10-27 (×2): 25 ug via INTRAVENOUS

## 2019-10-27 MED ORDER — ONDANSETRON HCL 4 MG/2ML IJ SOLN: 4.0000 mg | Freq: Once | INTRAMUSCULAR | Status: DC | PRN

## 2019-10-27 MED ORDER — DEXAMETHASONE SODIUM PHOSPHATE 10 MG/ML IJ SOLN
INTRAMUSCULAR | Status: DC | PRN
  Administered 2019-10-27: 10 mg via INTRAVENOUS

## 2019-10-27 MED ORDER — LINEZOLID 600 MG PO TABS
600.0000 mg | ORAL_TABLET | Freq: Two times a day (BID) | ORAL | 0 refills | Status: DC
Start: 2019-10-27 — End: 2019-10-27

## 2019-10-27 MED ORDER — KETOROLAC TROMETHAMINE 30 MG/ML IJ SOLN
INTRAMUSCULAR | Status: DC | PRN
  Administered 2019-10-27: 30 mg via INTRAVENOUS

## 2019-10-27 MED ORDER — CEFAZOLIN SODIUM-DEXTROSE 2-4 GM/100ML-% IV SOLN
INTRAVENOUS | Status: AC
  Filled 2019-10-27: qty 100

## 2019-10-27 MED ORDER — MIDAZOLAM HCL 2 MG/2ML IJ SOLN
INTRAMUSCULAR | Status: AC
  Filled 2019-10-27: qty 2

## 2019-10-27 MED ORDER — HYDROCODONE-ACETAMINOPHEN 5-325 MG PO TABS
ORAL_TABLET | ORAL | Status: AC
  Filled 2019-10-27: qty 1

## 2019-10-27 MED ORDER — LACTATED RINGERS IV SOLN: INTRAVENOUS | Status: DC

## 2019-10-27 MED ORDER — PROPOFOL 10 MG/ML IV BOLUS
INTRAVENOUS | Status: DC | PRN
  Administered 2019-10-27: 200 mg via INTRAVENOUS

## 2019-10-27 MED ORDER — MIDAZOLAM HCL 2 MG/2ML IJ SOLN
INTRAMUSCULAR | Status: DC | PRN
  Administered 2019-10-27: 2 mg via INTRAVENOUS

## 2019-10-27 MED ORDER — SUCCINYLCHOLINE CHLORIDE 20 MG/ML IJ SOLN
INTRAMUSCULAR | Status: DC | PRN
  Administered 2019-10-27: 120 mg via INTRAVENOUS

## 2019-10-27 MED ORDER — ROCURONIUM BROMIDE 100 MG/10ML IV SOLN
INTRAVENOUS | Status: DC | PRN
  Administered 2019-10-27: 40 mg via INTRAVENOUS

## 2019-10-27 MED ORDER — EPHEDRINE 5 MG/ML INJ
INTRAVENOUS | Status: AC
  Filled 2019-10-27: qty 10

## 2019-10-27 MED ORDER — SUCCINYLCHOLINE CHLORIDE 200 MG/10ML IV SOSY
PREFILLED_SYRINGE | INTRAVENOUS | Status: AC
  Filled 2019-10-27: qty 20

## 2019-10-27 MED ORDER — PHENYLEPHRINE HCL (PRESSORS) 10 MG/ML IV SOLN
INTRAVENOUS | Status: AC
  Filled 2019-10-27: qty 1

## 2019-10-27 MED ORDER — ACETAMINOPHEN 10 MG/ML IV SOLN
INTRAVENOUS | Status: DC | PRN
  Administered 2019-10-27: 1000 mg via INTRAVENOUS

## 2019-10-27 MED ORDER — IBUPROFEN 800 MG PO TABS: 800.0000 mg | ORAL_TABLET | Freq: Three times a day (TID) | ORAL | 1 refills | Status: AC

## 2019-10-27 MED ORDER — HYDROMORPHONE HCL 1 MG/ML IJ SOLN
INTRAMUSCULAR | Status: AC
  Filled 2019-10-27: qty 1

## 2019-10-27 MED ORDER — SUGAMMADEX SODIUM 200 MG/2ML IV SOLN
INTRAVENOUS | Status: DC | PRN
  Administered 2019-10-27: 200 mg via INTRAVENOUS

## 2019-10-27 MED ORDER — CHLORHEXIDINE GLUCONATE 0.12 % MT SOLN
OROMUCOSAL | Status: AC
  Administered 2019-10-27: 15 mL via OROMUCOSAL
  Filled 2019-10-27: qty 15

## 2019-10-27 MED ORDER — ACETAMINOPHEN 500 MG PO TABS: 1000.0000 mg | ORAL_TABLET | Freq: Three times a day (TID) | ORAL | 2 refills | Status: AC

## 2019-10-27 MED ORDER — CHLORHEXIDINE GLUCONATE 0.12 % MT SOLN: 15.0000 mL | Freq: Once | OROMUCOSAL | Status: AC

## 2019-10-27 MED ORDER — LIDOCAINE HCL URETHRAL/MUCOSAL 2 % EX GEL
CUTANEOUS | Status: AC
  Filled 2019-10-27: qty 10

## 2019-10-27 MED ORDER — ORAL CARE MOUTH RINSE: 15.0000 mL | Freq: Once | OROMUCOSAL | Status: AC

## 2019-10-27 MED ORDER — CEFAZOLIN SODIUM-DEXTROSE 2-4 GM/100ML-% IV SOLN
2.0000 g | INTRAVENOUS | Status: AC
  Administered 2019-10-27: 2 g via INTRAVENOUS

## 2019-10-27 MED ORDER — HYDROMORPHONE HCL 1 MG/ML IJ SOLN
INTRAMUSCULAR | Status: DC | PRN
  Administered 2019-10-27 (×2): .5 mg via INTRAVENOUS

## 2019-10-27 MED ORDER — BUPIVACAINE HCL (PF) 0.5 % IJ SOLN
INTRAMUSCULAR | Status: AC
  Filled 2019-10-27: qty 30

## 2019-10-27 MED ORDER — ESMOLOL HCL 100 MG/10ML IV SOLN
INTRAVENOUS | Status: AC
  Filled 2019-10-27: qty 10

## 2019-10-27 MED ORDER — HYDROCODONE-ACETAMINOPHEN 5-325 MG PO TABS
1.0000 | ORAL_TABLET | Freq: Once | ORAL | Status: AC
  Administered 2019-10-27: 1 via ORAL

## 2019-10-27 MED ORDER — ACETAMINOPHEN 10 MG/ML IV SOLN
INTRAVENOUS | Status: AC
  Filled 2019-10-27: qty 100

## 2019-10-27 MED ORDER — LEVOFLOXACIN 500 MG PO TABS
500.0000 mg | ORAL_TABLET | Freq: Every day | ORAL | 0 refills | Status: DC
Start: 2019-10-27 — End: 2019-10-27

## 2019-10-27 MED ORDER — DEXAMETHASONE SODIUM PHOSPHATE 10 MG/ML IJ SOLN
INTRAMUSCULAR | Status: AC
  Filled 2019-10-27: qty 2

## 2019-10-27 MED ORDER — ONDANSETRON HCL 4 MG/2ML IJ SOLN
INTRAMUSCULAR | Status: AC
  Filled 2019-10-27: qty 8

## 2019-10-27 MED ORDER — FENTANYL CITRATE (PF) 100 MCG/2ML IJ SOLN
25.0000 ug | INTRAMUSCULAR | Status: AC | PRN
  Administered 2019-10-27 (×4): 25 ug via INTRAVENOUS

## 2019-10-27 MED ORDER — ONDANSETRON 4 MG PO TBDP
4.0000 mg | ORAL_TABLET | Freq: Three times a day (TID) | ORAL | 0 refills | Status: DC | PRN
Start: 2019-10-27 — End: 2021-05-08

## 2019-10-27 SURGICAL SUPPLY — 68 items
ADAPTER IRRIG TUBE 2 SPIKE SOL (ADAPTER) ×8 IMPLANT
ADPR TBG 2 SPK PMP STRL ASCP (ADAPTER) ×4
APL PRP STRL LF DISP 70% ISPRP (MISCELLANEOUS) ×2
APL PRP STRL LF ISPRP CHG 10.5 (MISCELLANEOUS) ×2
APPLICATOR CHLORAPREP 10.5 ORG (MISCELLANEOUS) ×4 IMPLANT
BLADE SURG SZ11 CARB STEEL (BLADE) ×4 IMPLANT
BNDG ESMARK 6X12 TAN STRL LF (GAUZE/BANDAGES/DRESSINGS) IMPLANT
BRUSH SCRUB EZ  4% CHG (MISCELLANEOUS)
BRUSH SCRUB EZ 4% CHG (MISCELLANEOUS) IMPLANT
BUR RADIUS 3.5 (BURR) IMPLANT
BUR RADIUS 4.0X18.5 (BURR) IMPLANT
CANISTER SUCT 1200ML W/VALVE (MISCELLANEOUS) IMPLANT
CHLORAPREP W/TINT 26 (MISCELLANEOUS) ×4 IMPLANT
CLOSURE WOUND 1/2 X4 (GAUZE/BANDAGES/DRESSINGS)
COOLER POLAR GLACIER W/PUMP (MISCELLANEOUS) ×4 IMPLANT
COVER WAND RF STERILE (DRAPES) ×4 IMPLANT
CUFF TOURN SGL QUICK 24 (TOURNIQUET CUFF)
CUFF TOURN SGL QUICK 30 (TOURNIQUET CUFF) ×4
CUFF TRNQT CYL 24X4X16.5-23 (TOURNIQUET CUFF) IMPLANT
CUFF TRNQT CYL 30X4X21-28X (TOURNIQUET CUFF) ×2 IMPLANT
DRAIN PENROSE 1/4X12 LTX STRL (WOUND CARE) ×4 IMPLANT
DRAPE IMP U-DRAPE 54X76 (DRAPES) ×4 IMPLANT
ELECT CAUTERY BLADE 6.4 (BLADE) ×4 IMPLANT
ELECT REM PT RETURN 9FT ADLT (ELECTROSURGICAL) ×4
ELECTRODE REM PT RTRN 9FT ADLT (ELECTROSURGICAL) ×2 IMPLANT
GAUZE SPONGE 4X4 12PLY STRL (GAUZE/BANDAGES/DRESSINGS) ×4 IMPLANT
GAUZE XEROFORM 1X8 LF (GAUZE/BANDAGES/DRESSINGS) IMPLANT
GLOVE BIOGEL PI IND STRL 8 (GLOVE) ×2 IMPLANT
GLOVE BIOGEL PI INDICATOR 8 (GLOVE) ×2
GLOVE SURG ORTHO 8.0 STRL STRW (GLOVE) ×4 IMPLANT
GLOVE SURG SYN 7.5  E (GLOVE) ×2
GLOVE SURG SYN 7.5 E (GLOVE) ×2 IMPLANT
GOWN STRL REUS W/ TWL LRG LVL3 (GOWN DISPOSABLE) ×2 IMPLANT
GOWN STRL REUS W/ TWL XL LVL3 (GOWN DISPOSABLE) ×2 IMPLANT
GOWN STRL REUS W/TWL LRG LVL3 (GOWN DISPOSABLE) ×4
GOWN STRL REUS W/TWL XL LVL3 (GOWN DISPOSABLE) ×4
GRADUATE 1200CC STRL 31836 (MISCELLANEOUS) ×4 IMPLANT
IV LACTATED RINGER IRRG 3000ML (IV SOLUTION)
IV LR IRRIG 3000ML ARTHROMATIC (IV SOLUTION) IMPLANT
KIT TURNOVER KIT A (KITS) ×4 IMPLANT
MANIFOLD NEPTUNE II (INSTRUMENTS) ×4 IMPLANT
MAT ABSORB  FLUID 56X50 GRAY (MISCELLANEOUS) ×2
MAT ABSORB FLUID 56X50 GRAY (MISCELLANEOUS) ×2 IMPLANT
NEEDLE HYPO 22GX1.5 SAFETY (NEEDLE) ×4 IMPLANT
NS IRRIG 500ML POUR BTL (IV SOLUTION) ×8 IMPLANT
PACK ARTHROSCOPY KNEE (MISCELLANEOUS) ×4 IMPLANT
PACK EXTREMITY (MISCELLANEOUS) IMPLANT
PACK KNEE ARTHRO (MISCELLANEOUS) ×4 IMPLANT
PAD ABD DERMACEA PRESS 5X9 (GAUZE/BANDAGES/DRESSINGS) ×8 IMPLANT
PAD WRAPON POLAR KNEE (MISCELLANEOUS) ×2 IMPLANT
PENCIL SMOKE ULTRAEVAC 22 CON (MISCELLANEOUS) ×4 IMPLANT
SET TUBE SUCT SHAVER OUTFL 24K (TUBING) IMPLANT
SET TUBE TIP INTRA-ARTICULAR (MISCELLANEOUS) ×4 IMPLANT
STRIP CLOSURE SKIN 1/2X4 (GAUZE/BANDAGES/DRESSINGS) IMPLANT
SUCTION FRAZIER HANDLE 10FR (MISCELLANEOUS) ×2
SUCTION TUBE FRAZIER 10FR DISP (MISCELLANEOUS) ×2 IMPLANT
SUT ETHILON 3-0 FS-10 30 BLK (SUTURE) ×4
SUT MNCRL 4-0 (SUTURE)
SUT MNCRL 4-0 27XMFL (SUTURE)
SUT VIC AB 0 CT1 36 (SUTURE) IMPLANT
SUT VIC AB 2-0 CT2 27 (SUTURE) IMPLANT
SUTURE EHLN 3-0 FS-10 30 BLK (SUTURE) ×2 IMPLANT
SUTURE MNCRL 4-0 27XMF (SUTURE) IMPLANT
SYR BULB IRRIG 60ML STRL (SYRINGE) ×4 IMPLANT
TUBING ARTHRO INFLOW-ONLY STRL (TUBING) IMPLANT
WAND HAND CNTRL MULTIVAC 50 (MISCELLANEOUS) IMPLANT
WAND WEREWOLF FLOW 90D (MISCELLANEOUS) IMPLANT
WRAPON POLAR PAD KNEE (MISCELLANEOUS) ×4

## 2019-10-27 NOTE — Discharge Instructions (Signed)
Knee Surgery   Post-Op Instructions   1. Bracing or crutches: Crutches will be provided at the time of discharge from the surgery center if you do not already have them.   2. Ice: You may be provided with a device [facility]) that allows you to ice the affected area effectively. Otherwise you can ice manually.    3. Driving:  Plan on not driving for at least one week. Please note that you are advised NOT to drive while taking narcotic pain medications as you may be impaired and unsafe to drive.   4. Activity: Ankle pumps several times an hour while awake to prevent blood clots. Weight bearing: as tolerated. Use crutches for as needed (usually ~3 days or less) until pain allows you to ambulate without a limp. Elevate knee above heart level as much as possible for one week. Avoid standing more than 5 minutes (consecutively) for the first week.  Avoid long distance travel for 2 weeks.   5. Medications:  - Take Linezolid (antiobiotic) twice daily for 14 days (finish entire course of 28 tablets even if not having significant pain). Take first dose the evening after surgery. - Take Levaquin (antiobiotic) once daily for 14 days (finish entire course of 28 tablets even if not having significant pain). Take first dose the evening after surgery. - You have been provided a prescription for narcotic pain medicine. After surgery, take 1-2 narcotic tablets every 4 hours if needed for severe pain.  - You may take up to 3000mg /day of tylenol (acetaminophen). You can take 1000mg  3x/day. Please check your narcotic. If you have acetaminophen in your narcotic (each tablet will be 325mg ), be careful not to exceed a total of 3000mg /day of acetaminophen.  - A prescription for anti-nausea medication will be provided in case the narcotic medicine causes nausea - take 1 tablet every 6 hours only if nauseated.  - Take ibuprofen 800 mg every 8 hours WITH food to reduce post-operative knee swelling and pain - Take enteric  coated aspirin 325 mg once daily for 2 weeks to prevent blood clots.   6. Bandages: Maintain bandages until postoperative appointment. Do not get incision wet for at least 2 weeks until follow up visit with Dr. .    7. Post-Op Appointments: Your first post-op appointment will be with , PA in 4 days (10/30/19 @1pm  in [facility] office) to remove drains. Follow up with Dr. Allena Katz in approximately 2 weeks time.     AMBULATORY SURGERY  DISCHARGE INSTRUCTIONS   1) The drugs that you were given will stay in your system until tomorrow so for the next 24 hours you should not:  A) Drive an automobile B) Make any legal decisions C) Drink any alcoholic beverage   2) You may resume regular meals tomorrow.  Today it is better to start with liquids and gradually work up to solid foods.  You may eat anything you prefer, but it is better to start with liquids, then soup and crackers, and gradually work up to solid foods.   3) Please notify your doctor immediately if you have any unusual bleeding, trouble breathing, redness and pain at the surgery site, drainage, fever, or pain not relieved by medication.    4) Additional Instructions:        Please contact your physician with any problems or Same Day Surgery at [phone], Monday through Friday 6 am to 4 pm, or [place] at [facility] number at [phone].

## 2019-10-27 NOTE — Op Note (Signed)
Operative Note   SURGERY DATE: 10/27/2019   PRE-OP DIAGNOSIS:  1.  Left knee infrapatellar bursa infection  POST-OP DIAGNOSIS: 1.  Left knee infrapatellar bursa infection  PROCEDURES:  1. Left knee irrigation and debridement of infrapatellar bursa of the knee  SURGEON: Rosealee Albee, MD  ASSISTANT: none  ANESTHESIA: Gen   ESTIMATED BLOOD LOSS:25cc  TOTAL IV FLUIDS: per anesthesia  DRAIN: none  INDICATION(S): Ethan George is a 44 y.o. male with a known history of medial meniscus tear who developed an infrapatellar bursa infection.  He underwent irrigation and debridement of this on 09/28/2019.  Cultures were negative and he was continued on Bactrim for 2 weeks.  He noted dramatic improvement in his symptoms and had returned back to work for a few days until he began to develop recurrent symptoms. Pain is again located over the anterior aspect of the knee over the patellar tendon and the patient had severe pain with attempted knee flexion beyond 70 degrees.  Knee joint aspiration was negative for septic arthritis, but did show gout crystals, consistent with known history of prior gout.  However, this was not an acute gouty flare as cell count and neutrophil percent was quite low.  Ultrasound evaluation did show fluid about the infrapatellar region.  After discussion of risks, benefits, and alternatives to surgery, the patient elected to proceed.    OPERATIVE FINDINGS:  Increased fluid about the infrapatellar region with mildly purulent material   OPERATIVE REPORT:  I identified Ethan Schepers Jonesin the pre-operative holding area. Informed consent was obtained and the surgical site was marked. I reviewed the risks and benefits of the proposed surgical intervention and the patient wished to proceed. The patient was transferred to the operative suite and general anesthesia was administered. The patient was placed in the supine position. All down side pressure points were  appropriately padded. The extremity was then prepped and draped in standard fashion. A time out was performed confirming the correct extremity, correct patient,and correct procedure.  Preoperative antibiotics were held until culture could be obtained.  The previous incision over the lateral border of the patellar tendon was utilized to start.  There was some immediate return of serosanguineous fluid.  This was cultured as the superficial culture sample. Dissection was carried down to the patellar tendon itself.  The lateral border was identified.  Using the Metzenbaum scissors, the infrapatellar bursa region was entered.  A deep culture sample was obtained, and there was some purulent material in this region.  Appropriate IV antibiotics were then administered.    Next, an incision over the medial border of the patellar tendon was made.  Dissection was carried down to the medial border of the patellar tendon.  Metzenbaum scissors were used to dissect to the infrapatellar region such that there was continuity with the lateral based approach made previously. A rongeur was used to remove any infected tissue as well as loose, devitalized tissue and loose fat pad tissue.  Next, a curette was used to debride the posterior aspect of the patellar tendon, the anterior cortex of the proximal tibia, and the fat pad.  There was no extension of grossly purulent material beyond the infrapatellar space.  The wound was thoroughly irrigated with bulb syringe.    2 Penrose drains were placed into the infrapatellar space, with one exiting out the medial wound, and the other exiting out of the lateral.  3-0 nylon sutures were used to close the skin around the Penrose drains.  Wounds  were dressed sterilely.  The patient was then awakened from anesthesia, and he was then transported to the PACU without further complication.   POSTOPERATIVE PLAN: The patient will be discharged home on p.o. antibiotics.   After discussion with  infectious diseases team, recommendation was made for 2 weeks of linezolid and Levaquin. Aspirin 325 mg/day x2 weeks for DVT prophylaxis. The patient will follow-up in 3 days for drain removal.

## 2019-10-27 NOTE — H&P (Signed)
Paper H&P to be scanned into permanent record. H&P reviewed. No significant changes noted.  Synovial fluid analysis from yesterday did not suggest infection or acute inflammatory process, although gout crystals were present (patient has history of gout and is on allopurinol). Plan to proceed with isolated infrapatellar bursa I&D.

## 2019-10-27 NOTE — Transfer of Care (Addendum)
Immediate Anesthesia Transfer of Care Note  Patient: RANVIR RENOVATO  Procedure(s) Performed: Left open infrapatellar bursa irrigation and debridement (Left ) ARTHROSCOPY KNEE (Left Knee)  Patient Location: PACU  Anesthesia Type:General  Level of Consciousness: awake, drowsy and patient cooperative  Airway & Oxygen Therapy: Patient Spontanous Breathing and Patient connected to face mask oxygen  Post-op Assessment: Report given to RN and Post -op Vital signs reviewed and stable  Post vital signs: Reviewed and stable  Last Vitals:  Vitals Value Taken Time  BP 138/95 10/27/19 1437  Temp 36.4 C 10/27/19 1437  Pulse 74 10/27/19 1440  Resp 12 10/27/19 1440  SpO2 93 % 10/27/19 1440  Vitals shown include unvalidated device data.  Last Pain:  Vitals:   10/27/19 1437  TempSrc:   PainSc: 8          Complications: No complications documented.

## 2019-10-27 NOTE — Anesthesia Procedure Notes (Signed)
Procedure Name: Intubation Performed by: Fletcher-Harrison, Tyrhonda Georgiades, CRNA Pre-anesthesia Checklist: Patient identified, Emergency Drugs available, Suction available and Patient being monitored Patient Re-evaluated:Patient Re-evaluated prior to induction Oxygen Delivery Method: Circle system utilized Preoxygenation: Pre-oxygenation with 100% oxygen Induction Type: IV induction Ventilation: Mask ventilation without difficulty Tube type: Oral Tube size: 7.0 mm Number of attempts: 1 Airway Equipment and Method: Stylet and Oral airway Placement Confirmation: ETT inserted through vocal cords under direct vision,  positive ETCO2,  breath sounds checked- equal and bilateral and CO2 detector Secured at: 21 cm Tube secured with: Tape Dental Injury: Teeth and Oropharynx as per pre-operative assessment        

## 2019-10-28 ENCOUNTER — Encounter: Payer: Self-pay | Admitting: Orthopedic Surgery

## 2019-10-29 LAB — FUNGAL ORGANISM REFLEX

## 2019-10-29 LAB — FUNGUS CULTURE WITH STAIN

## 2019-10-29 LAB — FUNGUS CULTURE RESULT

## 2019-10-29 NOTE — Anesthesia Postprocedure Evaluation (Signed)
Anesthesia Post Note  Patient: KYLON PHILBROOK  Procedure(s) Performed: Left open infrapatellar bursa irrigation and debridement (Left ) ARTHROSCOPY KNEE (Left Knee)  Patient location during evaluation: PACU Anesthesia Type: General Level of consciousness: awake and alert Pain management: pain level controlled Vital Signs Assessment: post-procedure vital signs reviewed and stable Respiratory status: spontaneous breathing, nonlabored ventilation, respiratory function stable and patient connected to nasal cannula oxygen Cardiovascular status: blood pressure returned to baseline and stable Postop Assessment: no apparent nausea or vomiting Anesthetic complications: no   No complications documented.   Last Vitals:  Vitals:   10/27/19 1624 10/27/19 1635  BP: 134/74   Pulse: (!) 55 72  Resp: 18   Temp: (!) 36.2 C   SpO2: 94% 96%    Last Pain:  Vitals:   10/28/19 0901  TempSrc:   PainSc: 0-No pain                 Yevette Edwards

## 2019-10-29 NOTE — Anesthesia Preprocedure Evaluation (Signed)
Anesthesia Evaluation  Patient identified by MRN, date of birth, ID band Patient awake    Reviewed: Allergy & Precautions, H&P , NPO status , Patient's Chart, lab work & pertinent test results, reviewed documented beta blocker date and time   Airway Mallampati: II  TM Distance: >3 FB Neck ROM: full    Dental  (+) Teeth Intact   Pulmonary neg pulmonary ROS, Current Smoker and Patient abstained from smoking.,    Pulmonary exam normal        Cardiovascular Exercise Tolerance: Good negative cardio ROS Normal cardiovascular exam Rate:Normal     Neuro/Psych negative neurological ROS  negative psych ROS   GI/Hepatic Neg liver ROS, GERD  Medicated,  Endo/Other  negative endocrine ROS  Renal/GU negative Renal ROS  negative genitourinary   Musculoskeletal   Abdominal   Peds  Hematology negative hematology ROS (+)   Anesthesia Other Findings   Reproductive/Obstetrics negative OB ROS                             Anesthesia Physical Anesthesia Plan  ASA: II  Anesthesia Plan: General ETT   Post-op Pain Management:    Induction:   PONV Risk Score and Plan:   Airway Management Planned:   Additional Equipment:   Intra-op Plan:   Post-operative Plan:   Informed Consent: I have reviewed the patients History and Physical, chart, labs and discussed the procedure including the risks, benefits and alternatives for the proposed anesthesia with the patient or authorized representative who has indicated his/her understanding and acceptance.       Plan Discussed with: CRNA  Anesthesia Plan Comments:         Anesthesia Quick Evaluation

## 2019-10-30 LAB — ACID FAST SMEAR (AFB, MYCOBACTERIA)
Acid Fast Smear: NEGATIVE
Acid Fast Smear: NEGATIVE

## 2019-11-01 LAB — AEROBIC/ANAEROBIC CULTURE W GRAM STAIN (SURGICAL/DEEP WOUND)
Culture: NO GROWTH
Culture: NO GROWTH

## 2019-12-10 LAB — ACID FAST CULTURE WITH REFLEXED SENSITIVITIES (MYCOBACTERIA)
Acid Fast Culture: NEGATIVE
Acid Fast Culture: NEGATIVE

## 2020-06-15 ENCOUNTER — Encounter: Payer: Self-pay | Admitting: Emergency Medicine

## 2020-06-15 ENCOUNTER — Ambulatory Visit
Admission: EM | Admit: 2020-06-15 | Discharge: 2020-06-15 | Disposition: A | Discharge status: Home / Self Care | Payer: Managed Care, Other (non HMO) | Attending: Family Medicine | Admitting: Family Medicine

## 2020-06-15 ENCOUNTER — Ambulatory Visit (INDEPENDENT_AMBULATORY_CARE_PROVIDER_SITE_OTHER): Payer: Managed Care, Other (non HMO)

## 2020-06-15 ENCOUNTER — Other Ambulatory Visit: Payer: Self-pay

## 2020-06-15 DIAGNOSIS — R0981 Nasal congestion: Secondary | ICD-10-CM | POA: Diagnosis present

## 2020-06-15 DIAGNOSIS — R059 Cough, unspecified: Secondary | ICD-10-CM | POA: Diagnosis not present

## 2020-06-15 DIAGNOSIS — Z20822 Contact with and (suspected) exposure to covid-19: Secondary | ICD-10-CM | POA: Diagnosis not present

## 2020-06-15 DIAGNOSIS — F17210 Nicotine dependence, cigarettes, uncomplicated: Secondary | ICD-10-CM | POA: Insufficient documentation

## 2020-06-15 DIAGNOSIS — R0602 Shortness of breath: Secondary | ICD-10-CM

## 2020-06-15 DIAGNOSIS — J209 Acute bronchitis, unspecified: Secondary | ICD-10-CM

## 2020-06-15 MED ORDER — CHERATUSSIN AC 100-10 MG/5ML PO SOLN
5.0000 mL | Freq: Four times a day (QID) | ORAL | 0 refills | Status: DC | PRN
Start: 2020-06-15 — End: 2021-01-18

## 2020-06-15 NOTE — ED Triage Notes (Signed)
Patient c/o cough, nasal congestion that started 2 days ago.

## 2020-06-15 NOTE — ED Provider Notes (Signed)
[facility]    CSN: [id number] Arrival date & time: 06/15/20  1031      History   Chief Complaint Chief Complaint  Patient presents with  . Cough  . Nasal Congestion    HPI Ethan George is a 45 y.o. male 2-day history of productive cough, nasal congestion, body aches and fatigue.  He also admits to a sore throat and feeling like his chest is tight and a little short of breath at times..  Patient states that his wife has similar symptoms and was recently diagnosed with bronchitis.  He has a history of getting bronchitis.  He is a longtime smoker.  Patient denies any known exposure to COVID-19.  He has been taking over-the-counter Mucinex for symptoms without improvement.  He denies any fever, sinus pain, ear pain, abdominal pain, nausea/vomiting or diarrhea.  No other complaints or concerns.  HPI  Past Medical History:  Diagnosis Date  . GERD (gastroesophageal reflux disease)   . Gout     Patient Active Problem List   Diagnosis Date Noted  . Sprain of foot 12/29/2018  . Cellulitis and abscess of left leg 12/31/2016  . GERD (gastroesophageal reflux disease) 12/31/2016  . Gout   . 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 Current tobacco use 10/01/2014    Past Surgical History:  Procedure Laterality Date  . BACK SURGERY  2012,2016   X 2  . I & D EXTREMITY Left 10/27/2019   Procedure: Left open infrapatellar bursa irrigation and debridement;  Surgeon: Signa KellPatel, Sunny, MD;  Location: [facility];  Service: Orthopedics;  Laterality: Left;  . KNEE ARTHROSCOPY Left 10/27/2019   Procedure: ARTHROSCOPY KNEE;  Surgeon: Signa KellPatel, Sunny, MD;  Location: [facility];  Service: Orthopedics;  Laterality: Left;  . KNEE ARTHROSCOPY WITH SUBCHONDROPLASTY Left 09/28/2019   Procedure: Left Knee open irrigation and debridement of the infrapatellar bursa.;  Surgeon: Signa KellPatel, Sunny, MD;  Location: [facility];  Service: Orthopedics;  Laterality: Left;       Home Medications     Prior to Admission medications   Medication Sig Start Date End Date Taking? Authorizing Provider  allopurinol (ZYLOPRIM) 300 MG tablet Take 300 mg by mouth daily.   Yes [provider]  guaiFENesin-codeine (CHERATUSSIN AC) 100-10 MG/5ML syrup Take 5 mLs by mouth 4 (four) times daily as needed for cough. 06/15/20  Yes Eusebio FriendlyEaves, Ameli Sangiovanni B, PA-C  ibuprofen (ADVIL) 800 MG tablet Take 800 mg by mouth every 8 (eight) hours as needed for moderate pain.   Yes [provider]  meloxicam (MOBIC) 15 MG tablet Take by mouth. 06/10/20 07/10/20 Yes [provider]  predniSONE (DELTASONE) 10 MG tablet 5,5,4,4,3,3,2,2,1,1 tab po tapering dose 06/10/20  Yes [provider]  tiZANidine (ZANAFLEX) 4 MG tablet Take by mouth. 06/10/20  Yes [provider]  acetaminophen (TYLENOL) 500 MG tablet Take 2 tablets (1,000 mg total) by mouth every 8 (eight) hours. 09/28/19 09/27/20  Signa KellPatel, Sunny, MD  acetaminophen (TYLENOL) 500 MG tablet Take 2 tablets (1,000 mg total) by mouth every 8 (eight) hours. 10/27/19 10/26/20  Signa KellPatel, Sunny, MD  HYDROcodone-acetaminophen [facility](NORCO) 5-325 MG tablet Take 1-2 tablets by mouth every 4 (four) hours as needed for moderate pain or severe pain. 10/27/19   Signa KellPatel, Sunny, MD  linezolid (ZYVOX) 600 MG tablet Take 1 tablet (600 mg total) by mouth 2 (two) times daily. 10/27/19   Signa KellPatel, Sunny, MD  ondansetron (ZOFRAN ODT) 4 MG disintegrating tablet Take 1 tablet (4 mg total)  by mouth every 8 (eight) hours as needed for nausea or vomiting. 09/28/19   Signa Kell, MD  ondansetron (ZOFRAN ODT) 4 MG disintegrating tablet Take 1 tablet (4 mg total) by mouth every 8 (eight) hours as needed for nausea or vomiting. 10/27/19   Signa Kell, MD  Omeprazole Magnesium (PRILOSEC OTC PO) Take by mouth as needed.  03/23/19  [provider]    Family History Family History  Problem Relation Age of Onset  . Heart attack Maternal Grandfather     Social History Social History    Tobacco Use  . Smoking status: Current Every Day Smoker    Packs/day: 1.50    Years: 20.00    Pack years: 30.00    Types: Cigarettes  . Smokeless tobacco: Never Used  Vaping Use  . Vaping Use: Never used  Substance Use Topics  . Alcohol use: No    Alcohol/week: 0.0 standard drinks  . Drug use: No     Allergies   Tramadol   Review of Systems Review of Systems  Constitutional: Positive for fatigue. Negative for fever.  HENT: Positive for congestion, postnasal drip, rhinorrhea and sore throat. Negative for sinus pressure and sinus pain.   Respiratory: Positive for cough, chest tightness and shortness of breath.   Cardiovascular: Negative for chest pain.  Gastrointestinal: Negative for abdominal pain, diarrhea, nausea and vomiting.  Musculoskeletal: Positive for myalgias.  Neurological: Negative for weakness, light-headedness and headaches.  Hematological: Negative for adenopathy.     Physical Exam Triage Vital Signs ED Triage Vitals [06/15/20 1112]  Enc Vitals Group     BP      Pulse      Resp      Temp      Temp src      SpO2      Weight 190 lb (86.2 kg)     Height 5\' 9"  (1.753 m)     Head Circumference      Peak Flow      Pain Score 0     Pain Loc      Pain Edu?      Excl. in GC?    No data found.  Updated Vital Signs BP (!) 125/92 (BP Location: Right Arm)   Pulse 88   Temp 98.4 F (36.9 C) (Oral)   Resp 18   Ht 5\' 9"  (1.753 m)   Wt 190 lb (86.2 kg)   SpO2 99%   BMI 28.06 kg/m       Physical Exam Vitals and nursing note reviewed.  Constitutional:      General: He is not in acute distress.    Appearance: Normal appearance. He is well-developed. He is ill-appearing. He is not toxic-appearing or diaphoretic.  HENT:     Head: Normocephalic and atraumatic.     Right Ear: Tympanic membrane, ear canal and external ear normal.     Left Ear: Tympanic membrane, ear canal and external ear normal.     Nose: Congestion and rhinorrhea present.      Mouth/Throat:     Mouth: Mucous membranes are moist.     Pharynx: Oropharynx is clear. Uvula midline. Posterior oropharyngeal erythema present. No oropharyngeal exudate.     Tonsils: No tonsillar abscesses.  Eyes:     General: No scleral icterus.       Right eye: No discharge.        Left eye: No discharge.     Conjunctiva/sclera: Conjunctivae normal.     Pupils: Pupils  are equal, round, and reactive to light.  Neck:     Thyroid: No thyromegaly.     Trachea: No tracheal deviation.  Cardiovascular:     Rate and Rhythm: Normal rate and regular rhythm.     Heart sounds: Normal heart sounds.  Pulmonary:     Effort: Pulmonary effort is normal. No respiratory distress.     Breath sounds: Rhonchi (diffuse rhonchi throughout chest) present. No wheezing or rales.  Musculoskeletal:     Cervical back: Neck supple.  Skin:    General: Skin is warm and dry.     Findings: No rash.  Neurological:     General: No focal deficit present.     Mental Status: He is alert. Mental status is at baseline.     Motor: No weakness.  Psychiatric:        Mood and Affect: Mood normal.        Behavior: Behavior normal.        Thought Content: Thought content normal.      UC Treatments / Results  Labs (all labs ordered are listed, but only abnormal results are displayed) Labs Reviewed  SARS CORONAVIRUS 2 (TAT 6-24 HRS)    EKG   Radiology DG Chest 2 View  Result Date: 06/15/2020 CLINICAL DATA:  Cough, shortness of breath. EXAM: CHEST - 2 VIEW COMPARISON:  March 23, 2019. FINDINGS: The heart size and mediastinal contours are within normal limits. Both lungs are clear. No pneumothorax or pleural effusion is noted. The visualized skeletal structures are unremarkable. IMPRESSION: No active cardiopulmonary disease. Electronically Signed   By: Lupita Raider M.D.   On: 06/15/2020 12:20    Procedures Procedures (including critical care time)  Medications Ordered in UC Medications - No data to  display  Initial Impression / Assessment and Plan / UC Course  I have reviewed the triage vital signs and the nursing notes.  Pertinent labs & imaging results that were available during my care of the patient were reviewed by me and considered in my medical decision making (see chart for details).   45 year old male presenting for 2-day history of cough, congestion, sore throat, chest tightness and feeling short of breath.  Wife has similar symptoms.  Vital signs are all stable in the clinic.  Oxygen saturations 99% he is afebrile.  Exam significant for nasal congestion and rhinorrhea, posterior pharyngeal erythema and diffuse rhonchi throughout chest.  Patient concern for possible pneumonia.  Chest x-ray obtained today to differentiate between bronchitis and pneumonia.  I do suspect viral bronchitis most likely since his wife has similar symptoms.  Covid test obtained.  Current CDC guidelines, isolation protocol and ED precautions reviewed patient.  Chest x-ray independently viewed by me.  X-ray negative for pneumonia.  Reviewed results with patient.  Advised him that he likely has a viral bronchitis.  Patient says that the cough is disrupting his sleep and over-the-counter medications are not helping.  I did prescribe Cheratussin.  Controlled substance database reviewed.  Advised patient that bronchitis symptoms can persist for a couple of weeks but he should follow-up if his symptoms are getting worse.  Advised follow-up especially if he gets a fever, worse cough or increased breathing difficulty.  Supportive care advised with increasing rest and fluids as well.  Final Clinical Impressions(s) / UC Diagnoses   Final diagnoses:  Acute bronchitis, unspecified organism  Cough  Nasal congestion     Discharge Instructions     You have received COVID testing today either  for positive exposure, concerning symptoms that could be related to COVID infection, screening purposes, or re-testing  after confirmed positive.  Your test obtained today checks for active viral infection in the last 1-2 weeks. If your test is negative now, you can still test positive later. So, if you do develop symptoms you should either get re-tested and/or isolate x 5 days and then strict mask use x 5 days (unvaccinated) or mask use x 10 days (vaccinated). Please follow CDC guidelines.  While Rapid antigen tests come back in 15-20 minutes, send out PCR/molecular test results typically come back within 1-3 days. In the mean time, if you are symptomatic, assume this could be a positive test and treat/monitor yourself as if you do have COVID.   We will call with test results if positive. Please download the MyChart app and set up a profile to access test results.   If symptomatic, go home and rest. Push fluids. Take Tylenol as needed for discomfort. Gargle warm salt water. Throat lozenges. Take Mucinex DM or Robitussin for cough. Humidifier in bedroom to ease coughing. Warm showers. Also review the COVID handout for more information.  COVID-19 INFECTION: The incubation period of COVID-19 is approximately 14 days after exposure, with most symptoms developing in roughly 4-5 days. Symptoms may range in severity from mild to critically severe. Roughly 80% of those infected will have mild symptoms. People of any age may become infected with COVID-19 and have the ability to transmit the virus. The most common symptoms include: fever, fatigue, cough, body aches, headaches, sore throat, nasal congestion, shortness of breath, nausea, vomiting, diarrhea, changes in smell and/or taste.    COURSE OF ILLNESS Some patients may begin with mild disease which can progress quickly into critical symptoms. If your symptoms are worsening please call ahead to the Emergency Department and proceed there for further treatment. Recovery time appears to be roughly 1-2 weeks for mild symptoms and 3-6 weeks for severe disease.   GO IMMEDIATELY TO  ER FOR FEVER YOU ARE UNABLE TO GET DOWN WITH TYLENOL, BREATHING PROBLEMS, CHEST PAIN, FATIGUE, LETHARGY, INABILITY TO EAT OR DRINK, ETC  QUARANTINE AND ISOLATION: To help decrease the spread of COVID-19 please remain isolated if you have COVID infection or are highly suspected to have COVID infection. This means -stay home and isolate to one room in the home if you live with others. Do not share a bed or bathroom with others while ill, sanitize and wipe down all countertops and keep common areas clean and disinfected. Stay home for 5 days. If you have no symptoms or your symptoms are resolving after 5 days, you can leave your house. Continue to wear a mask around others for 5 additional days. If you have been in close contact (within 6 feet) of someone diagnosed with COVID 19, you are advised to quarantine in your home for 14 days as symptoms can develop anywhere from 2-14 days after exposure to the virus. If you develop symptoms, you  must isolate.  Most current guidelines for COVID after exposure -unvaccinated: isolate 5 days and strict mask use x 5 days. Test on day 5 is possible -vaccinated: wear mask x 10 days if symptoms do not develop -You do not necessarily need to be tested for COVID if you have + exposure and  develop symptoms. Just isolate at home x10 days from symptom onset During this global pandemic, CDC advises to practice social distancing, try to stay at least 31ft away from others at all  times. Wear a face covering. Wash and sanitize your hands regularly and avoid going anywhere that is not necessary.  KEEP IN MIND THAT THE COVID TEST IS NOT 100% ACCURATE AND YOU SHOULD STILL DO EVERYTHING TO PREVENT POTENTIAL SPREAD OF VIRUS TO OTHERS (WEAR MASK, WEAR GLOVES, WASH HANDS AND SANITIZE REGULARLY). IF INITIAL TEST IS NEGATIVE, THIS MAY NOT MEAN YOU ARE DEFINITELY NEGATIVE. MOST ACCURATE TESTING IS DONE 5-7 DAYS AFTER EXPOSURE.   It is not advised by CDC to get re-tested after receiving a  positive COVID test since you can still test positive for weeks to months after you have already cleared the virus.   *If you have not been vaccinated for COVID, I strongly suggest you consider getting vaccinated as long as there are no contraindications.      ED Prescriptions    Medication Sig Dispense Auth. Provider   guaiFENesin-codeine (CHERATUSSIN AC) 100-10 MG/5ML syrup Take 5 mLs by mouth 4 (four) times daily as needed for cough. 120 mL Shirlee Latch, PA-C     I have reviewed the PDMP during this encounter.   Shirlee Latch, PA-C 06/15/20 1235

## 2020-06-15 NOTE — Discharge Instructions (Signed)

## 2020-06-16 LAB — SARS CORONAVIRUS 2 (TAT 6-24 HRS): SARS Coronavirus 2: NEGATIVE

## 2020-09-01 ENCOUNTER — Ambulatory Visit: Payer: Self-pay | Admitting: *Deleted

## 2020-09-01 NOTE — Telephone Encounter (Signed)
Pts wife called in wanting to get pt an appt and established with the office, she did no think he could wait until August to be seen. Pt was hoping someone could give him a call, pt is having chest pain.  Reason for Disposition  [1] Chest pain lasts > 5 minutes AND [2] occurred in past 3 days (72 hours) (Exception: feels exactly the same as previously diagnosed heartburn and has accompanying sour taste in mouth)  Answer Assessment - Initial Assessment Questions 1. LOCATION: "Where does it hurt?"       R side 2. RADIATION: "Does the pain go anywhere else?" (e.g., into neck, jaw, arms, back)     Back, neck 3. ONSET: "When did the chest pain begin?" (Minutes, hours or days)      1 week 4. PATTERN "Does the pain come and go, or has it been constant since it started?"  "Does it get worse with exertion?"      Constant- worse at time 5. DURATION: "How long does it last" (e.g., seconds, minutes, hours)     hours 6. SEVERITY: "How bad is the pain?"  (e.g., Scale 1-10; mild, moderate, or severe)    - MILD (1-3): doesn't interfere with normal activities     - MODERATE (4-7): interferes with normal activities or awakens from sleep    - SEVERE (8-10): excruciating pain, unable to do any normal activities       moderate 7. CARDIAC RISK FACTORS: "Do you have any history of heart problems or risk factors for heart disease?" (e.g., angina, prior heart attack; diabetes, high blood pressure, high cholesterol, smoker, or strong family history of heart disease)     Family history, smoker 8. PULMONARY RISK FACTORS: "Do you have any history of lung disease?"  (e.g., blood clots in lung, asthma, emphysema, birth control pills)     Hx bronchitis  9. CAUSE: "What do you think is causing the chest pain?"     unsure 10. OTHER SYMPTOMS: "Do you have any other symptoms?" (e.g., dizziness, nausea, vomiting, sweating, fever, difficulty breathing, cough)       Coughing- mucus, dizziness/vision changes, sometimes feels  heaviness in chest with breathing- mostly outside 11. PREGNANCY: "Is there any chance you are pregnant?" "When was your last menstrual period?"       N/a  Protocols used: Chest Pain-A-AH

## 2021-01-18 ENCOUNTER — Ambulatory Visit
Admission: EM | Admit: 2021-01-18 | Discharge: 2021-01-18 | Disposition: A | Discharge status: Home / Self Care | Payer: Managed Care, Other (non HMO) | Attending: Emergency Medicine | Admitting: Emergency Medicine

## 2021-01-18 ENCOUNTER — Encounter: Payer: Self-pay | Admitting: Emergency Medicine

## 2021-01-18 ENCOUNTER — Other Ambulatory Visit: Payer: Self-pay

## 2021-01-18 DIAGNOSIS — J4521 Mild intermittent asthma with (acute) exacerbation: Secondary | ICD-10-CM | POA: Insufficient documentation

## 2021-01-18 DIAGNOSIS — J069 Acute upper respiratory infection, unspecified: Secondary | ICD-10-CM | POA: Insufficient documentation

## 2021-01-18 DIAGNOSIS — Z20822 Contact with and (suspected) exposure to covid-19: Secondary | ICD-10-CM | POA: Diagnosis present

## 2021-01-18 LAB — RAPID INFLUENZA A&B ANTIGENS
Influenza A (ARMC): NEGATIVE
Influenza B (ARMC): NEGATIVE

## 2021-01-18 MED ORDER — PROMETHAZINE-DM 6.25-15 MG/5ML PO SYRP: 5.0000 mL | ORAL_SOLUTION | Freq: Four times a day (QID) | ORAL | 0 refills | Status: DC | PRN

## 2021-01-18 MED ORDER — FLUTICASONE PROPIONATE 50 MCG/ACT NA SUSP: 2.0000 | Freq: Every day | NASAL | 0 refills | Status: DC

## 2021-01-18 MED ORDER — AEROCHAMBER PLUS MISC: 2 refills | Status: DC

## 2021-01-18 MED ORDER — PREDNISONE 50 MG PO TABS: 50.0000 mg | ORAL_TABLET | Freq: Every day | ORAL | 0 refills | Status: AC

## 2021-01-18 MED ORDER — ALBUTEROL SULFATE HFA 108 (90 BASE) MCG/ACT IN AERS
1.0000 | INHALATION_SPRAY | RESPIRATORY_TRACT | 0 refills | Status: DC | PRN
Start: 2021-01-18 — End: 2021-05-08

## 2021-01-18 NOTE — Discharge Instructions (Addendum)
your COVID will be back in 6 to 24 hours.  I will call in Molnupiravir if it is positive.  2 puffs from your albuterol inhaler using your spacer every 4 hours for 2 days, then every 6 hours for 2 days, then as needed.  You may back off on the albuterol if you start to feel better sooner.  Take 600 mg of ibuprofen, 1000 mg of Tylenol together as needed for fevers, body aches, headaches.  Promethazine DM for the cough, Flonase, saline nasal irrigation with a Lloyd Huger Med rinse and distilled water as often as you want for the nasal congestion.  Continue Mucinex.  Here is a list of primary care providers who are taking new patients:  Dr. Elizabeth Sauer [address] [place] [address] [phone]  [facility] at [facility] [address] [place], [place] [address] [phone]  [facility] Care [place] [address] [place] [address]  [phone]  [facility] [address] [place], [place] [address] [phone]  [facility] [address] [place]  [phone] [place], [place] [address]  Here are clinics/ other resources who will see you if you do not have insurance. Some have certain criteria that you must meet. Call them and find out what they are:  [facility]: [address] [place], [place] [address] Phone: [phone] Hours: First and Third Saturdays of each Month, 9 a.m. - 1 p.m.  [facility]: [address], [address] [place], [place] [address] Phone: [phone] Hours: Tuesday, 4 p.m. - 8 p.m. Thursday, 1 p.m. - 8 p.m. Wednesday, 9 a.m. - [facility] [address] [place], [place] [address] Phone: [phone] Pharmacy Phone Number: [phone] Dental Phone Number: [phone] [facility] Insurance Help: [phone]  Dental Hours: Monday - Thursday, 8 a.m. - 6 p.m.  [facility] [address] [place], [place] [address] Phone: [phone] Pharmacy Phone Number:  [phone] [facility] Insurance Help: [phone]  [facility] [address] [place] [place], [place] [address] Phone: [phone] Pharmacy Phone Number: [phone] [facility] Insurance Help: [phone]  [facility] [address] [place], [place] [address] Phone: [phone] [facility] Insurance Help: [phone]   [facility] [address], [place], [place] [address] Phone: [phone]  Go to www.goodrx.com  or www.costplusdrugs.com to look up your medications. This will give you a list of where you can find your prescriptions at the most affordable prices. Or ask the pharmacist what the cash price is, or if they have any other discount programs available to help make your medication more affordable. This can be less expensive than what you would pay with insurance.

## 2021-01-18 NOTE — ED Provider Notes (Signed)
HPI  SUBJECTIVE:  Ethan George is a 45 y.o. male who presents with body aches, nasal congestion, rhinorrhea, postnasal drip, sore throat, cough productive of clear mucus, wheezing, shortness of breath, dyspnea on exertion starting yesterday.  No fevers, headaches, nausea, vomiting or diarrhea, abdominal pain.  He is unable to sleep at night secondary to the cough.  No COVID, flu, RSV exposure.  He did not get the COVID and flu vaccines.  No Antipyretic in the past 6 hours.  He tried Mucinex with improvement in his symptoms.  Symptoms are worse with exertion.  He has a past medical history of asthma and is a smoker.  PMD: None.    Past Medical History:  Diagnosis Date   GERD (gastroesophageal reflux disease)    Gout     Past Surgical History:  Procedure Laterality Date   BACK SURGERY  2012,2016   X 2   I & D EXTREMITY Left 10/27/2019   Procedure: Left open infrapatellar bursa irrigation and debridement;  Surgeon: Signa Kell, MD;  Location: [facility];  Service: Orthopedics;  Laterality: Left;   KNEE ARTHROSCOPY Left 10/27/2019   Procedure: ARTHROSCOPY KNEE;  Surgeon: Signa Kell, MD;  Location: [facility];  Service: Orthopedics;  Laterality: Left;   KNEE ARTHROSCOPY WITH SUBCHONDROPLASTY Left 09/28/2019   Procedure: Left Knee open irrigation and debridement of the infrapatellar bursa.;  Surgeon: Signa Kell, MD;  Location: [facility];  Service: Orthopedics;  Laterality: Left;    Family History  Problem Relation Age of Onset   Heart attack Maternal Grandfather     Social History   Tobacco Use   Smoking status: Every Day    Packs/day: 1.50    Years: 20.00    Pack years: 30.00    Types: Cigarettes   Smokeless tobacco: Never  Vaping Use   Vaping Use: Never used  Substance Use Topics   Alcohol use: No    Alcohol/week: 0.0 standard drinks   Drug use: No    No current facility-administered medications for this encounter.  Current Outpatient Medications:    albuterol (VENTOLIN  HFA) 108 (90 Base) MCG/ACT inhaler, Inhale 1-2 puffs into the lungs every 4 (four) hours as needed for wheezing or shortness of breath., Disp: 1 each, Rfl: 0   fluticasone (FLONASE) 50 MCG/ACT nasal spray, Place 2 sprays into both nostrils daily., Disp: 16 g, Rfl: 0   predniSONE (DELTASONE) 50 MG tablet, Take 1 tablet (50 mg total) by mouth daily with breakfast for 5 days., Disp: 5 tablet, Rfl: 0   promethazine-dextromethorphan (PROMETHAZINE-DM) 6.25-15 MG/5ML syrup, Take 5 mLs by mouth 4 (four) times daily as needed for cough., Disp: 118 mL, Rfl: 0   Spacer/Aero-Holding Chambers (AEROCHAMBER PLUS) inhaler, Use with inhaler, Disp: 1 each, Rfl: 2   allopurinol (ZYLOPRIM) 300 MG tablet, Take 300 mg by mouth daily., Disp: , Rfl:    ondansetron (ZOFRAN ODT) 4 MG disintegrating tablet, Take 1 tablet (4 mg total) by mouth every 8 (eight) hours as needed for nausea or vomiting., Disp: 20 tablet, Rfl: 0  Allergies  Allergen Reactions   Tramadol Hives and Itching     ROS  As noted in HPI.   Physical Exam  BP (!) 142/88   Pulse 64   Temp 97.7 F (36.5 C) (Oral)   Resp 18   SpO2 100%   Constitutional: Well developed, well nourished, no acute distress Eyes:  EOMI, conjunctiva normal bilaterally HENT: Normocephalic, atraumatic,mucus membranes moist.  Mild nasal congestion .  Erythematous  oropharynx, tonsils normal without exudates, uvula midline.  Positive extensive postnasal drip.  Neck: No cervical lymphadenopathy  Respiratory: Normal inspiratory effort, diffuse expiratory wheezing throughout all lung fields.  Fair air movement.  No anterior, lateral chest wall tenderness Cardiovascular: Normal rate regular rhythm, no murmurs rubs or gallops GI: nondistended skin: No rash, skin intact Musculoskeletal: no deformities Neurologic: Alert & oriented x 3, no focal neuro deficits Psychiatric: Speech and behavior appropriate   ED Course   Medications - No data to display  Orders Placed This  Encounter  Procedures   Rapid Influenza A&B Antigens    Standing Status:   Standing    Number of Occurrences:   1   SARS CORONAVIRUS 2 (TAT 6-24 HRS) Nasopharyngeal Nasopharyngeal Swab    Standing Status:   Standing    Number of Occurrences:   1    Results for orders placed or performed during the hospital encounter of 01/18/21 (from the past 24 hour(s))  Rapid Influenza A&B Antigens     Status: None   Collection Time: 01/18/21  2:11 PM   Specimen: Respiratory  Result Value Ref Range   Influenza A ([facility]) NEGATIVE NEGATIVE   Influenza B ([facility]) NEGATIVE NEGATIVE  SARS CORONAVIRUS 2 (TAT 6-24 HRS) Nasopharyngeal Nasopharyngeal Swab     Status: None   Collection Time: 01/18/21  3:03 PM   Specimen: Nasopharyngeal Swab  Result Value Ref Range   SARS Coronavirus 2 NEGATIVE NEGATIVE   No results found.  ED Clinical Impression  1. Upper respiratory tract infection, unspecified type   2. Encounter for laboratory testing for COVID-19 virus   3. Intermittent asthma with acute exacerbation, unspecified asthma severity      ED Assessment/Plan  Rapid flu negative.  Doubt influenza in the absence of fevers, abnormal vital signs.  Will check COVID.  He will qualify for Molnupiravir based on unvaccinated status and history of asthma.  Suspect URI triggering off asthma.  Home with prednisone 50 mg for 5 days, regularly scheduled albuterol for the next 4 days, Promethazine DM, Flonase, saline nasal irrigation, continue Mucinex, COVID work note.  Will provide primary care list for ongoing care and order assistance in finding a PMD.  COVID PCR negative.  Discussed labs, imaging, MDM, treatment plan, and plan for follow-up with patient. Discussed sn/sx that should prompt return to the ED. patient agrees with plan.   Meds ordered this encounter  Medications   promethazine-dextromethorphan (PROMETHAZINE-DM) 6.25-15 MG/5ML syrup    Sig: Take 5 mLs by mouth 4 (four) times daily as needed for cough.     Dispense:  118 mL    Refill:  0   predniSONE (DELTASONE) 50 MG tablet    Sig: Take 1 tablet (50 mg total) by mouth daily with breakfast for 5 days.    Dispense:  5 tablet    Refill:  0   albuterol (VENTOLIN HFA) 108 (90 Base) MCG/ACT inhaler    Sig: Inhale 1-2 puffs into the lungs every 4 (four) hours as needed for wheezing or shortness of breath.    Dispense:  1 each    Refill:  0   fluticasone (FLONASE) 50 MCG/ACT nasal spray    Sig: Place 2 sprays into both nostrils daily.    Dispense:  16 g    Refill:  0   Spacer/Aero-Holding Chambers (AEROCHAMBER PLUS) inhaler    Sig: Use with inhaler    Dispense:  1 each    Refill:  2    Please educate  patient on use      *This clinic note was created using Scientist, clinical (histocompatibility and immunogenetics). Therefore, there may be occasional mistakes despite careful proofreading.  ?    Domenick Gong, MD 01/19/21 [phone]

## 2021-01-18 NOTE — ED Triage Notes (Signed)
Pt presents today with c/o of cough, SOB, chest congestion and body aches that began yesterday. Denies fever. He took Mucinex this morning pta.

## 2021-01-19 LAB — SARS CORONAVIRUS 2 (TAT 6-24 HRS): SARS Coronavirus 2: NEGATIVE

## 2021-03-28 ENCOUNTER — Ambulatory Visit: Payer: Self-pay | Admitting: Family Medicine

## 2021-04-07 ENCOUNTER — Ambulatory Visit: Payer: Self-pay | Admitting: Family Medicine

## 2021-05-08 ENCOUNTER — Encounter: Payer: Self-pay | Admitting: Emergency Medicine

## 2021-05-08 ENCOUNTER — Other Ambulatory Visit: Payer: Self-pay

## 2021-05-08 ENCOUNTER — Ambulatory Visit
Admission: EM | Admit: 2021-05-08 | Discharge: 2021-05-08 | Disposition: A | Discharge status: Home / Self Care | Payer: Managed Care, Other (non HMO) | Attending: Emergency Medicine | Admitting: Emergency Medicine

## 2021-05-08 DIAGNOSIS — J209 Acute bronchitis, unspecified: Secondary | ICD-10-CM | POA: Diagnosis not present

## 2021-05-08 MED ORDER — BENZONATATE 100 MG PO CAPS: 200.0000 mg | ORAL_CAPSULE | Freq: Three times a day (TID) | ORAL | 0 refills | Status: DC

## 2021-05-08 MED ORDER — PROMETHAZINE-DM 6.25-15 MG/5ML PO SYRP: 5.0000 mL | ORAL_SOLUTION | Freq: Four times a day (QID) | ORAL | 0 refills | Status: DC | PRN

## 2021-05-08 MED ORDER — PREDNISONE 20 MG PO TABS: 40.0000 mg | ORAL_TABLET | Freq: Every day | ORAL | 0 refills | Status: DC

## 2021-05-08 MED ORDER — ALBUTEROL SULFATE HFA 108 (90 BASE) MCG/ACT IN AERS: 1.0000 | INHALATION_SPRAY | RESPIRATORY_TRACT | 0 refills | Status: DC | PRN

## 2021-05-08 NOTE — ED Provider Notes (Signed)
[facility]    CSN: [id number] Arrival date & time: 05/08/21  0807      History   Chief Complaint Chief Complaint  Patient presents with   Cough    HPI Ethan George is a 46 y.o. male.   Patient presents with chills, fever, body aches, nasal congestion, rhinorrhea, sore throat, nonproductive cough, shortness of breath with exertion, intermittent wheezing and generalized headaches for 1 day.  Cough has been interfering with sleep.  Tolerating food and liquids.  No known sick contacts.  Daily smoker.  History of GERD, gout.  Has attempted use of over-the-counter Mucinex and ibuprofen which have been minimally helpful.    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   Current tobacco use 10/01/2014    Past Surgical History:  Procedure Laterality Date   BACK SURGERY  2012,2016   X 2   I & D EXTREMITY Left 10/27/2019   Procedure: Left open infrapatellar bursa irrigation and debridement;  Surgeon: Signa Kell, MD;  Location: [facility];  Service: Orthopedics;  Laterality: Left;   KNEE ARTHROSCOPY Left 10/27/2019   Procedure: ARTHROSCOPY KNEE;  Surgeon: Signa Kell, MD;  Location: [facility];  Service: Orthopedics;  Laterality: Left;   KNEE ARTHROSCOPY WITH SUBCHONDROPLASTY Left 09/28/2019   Procedure: Left Knee open irrigation and debridement of the infrapatellar bursa.;  Surgeon: Signa Kell, MD;  Location: [facility];  Service: Orthopedics;  Laterality: Left;       Home Medications    Prior to Admission medications   Medication Sig Start Date End Date Taking? Authorizing Provider  albuterol (VENTOLIN HFA) 108 (90 Base) MCG/ACT inhaler Inhale 1-2 puffs into the lungs every 4  (four) hours as needed for wheezing or shortness of breath. 01/18/21  Yes Domenick Gong, MD  allopurinol (ZYLOPRIM) 300 MG tablet Take 300 mg by mouth daily.    [provider]  fluticasone (FLONASE) 50 MCG/ACT nasal spray Place 2 sprays into both nostrils daily. 01/18/21   Domenick Gong, MD  ondansetron (ZOFRAN ODT) 4 MG disintegrating tablet Take 1 tablet (4 mg total) by mouth every 8 (eight) hours as needed for nausea or vomiting. 10/27/19   Signa Kell, MD  promethazine-dextromethorphan (PROMETHAZINE-DM) 6.25-15 MG/5ML syrup Take 5 mLs by mouth 4 (four) times daily as needed for cough. 01/18/21   Domenick Gong, MD  Spacer/Aero-Holding Chambers (AEROCHAMBER PLUS) inhaler Use with inhaler 01/18/21   Domenick Gong, MD  Omeprazole Magnesium (PRILOSEC OTC PO) Take by mouth as needed.  03/23/19  [provider]    Family History Family History  Problem Relation Age of Onset   Heart attack Maternal Grandfather     Social History Social History   Tobacco Use   Smoking status: Every Day    Packs/day: 1.50    Years: 20.00    Pack years: 30.00    Types: Cigarettes   Smokeless tobacco: Never  Vaping Use   Vaping Use: Never used  Substance Use Topics   Alcohol use: No    Alcohol/week: 0.0 standard drinks   Drug use: No     Allergies   Tramadol   Review of Systems Review of Systems  Constitutional:  Positive for chills and fever.  HENT:  Positive for congestion, rhinorrhea and sore throat. Negative for dental problem, drooling, ear discharge, ear pain, facial swelling, hearing loss, mouth sores, nosebleeds, postnasal drip, sinus pressure, sinus pain, sneezing, tinnitus, trouble swallowing and voice change.   Respiratory:  Positive for cough, shortness of breath and wheezing. Negative for apnea, choking, chest tightness and stridor.   Cardiovascular: Negative.   Gastrointestinal: Negative.   Musculoskeletal:  Positive for myalgias. Negative for  arthralgias, back pain, gait problem, joint swelling, neck pain and neck stiffness.  Skin: Negative.   Neurological:  Positive for headaches. Negative for dizziness, tremors, seizures, syncope, facial asymmetry, speech difficulty, weakness, light-headedness and numbness.    Physical Exam Triage Vital Signs ED Triage Vitals  Enc Vitals Group     BP 05/08/21 0844 (!) 166/71     Pulse Rate 05/08/21 0844 82     Resp 05/08/21 0844 18     Temp 05/08/21 0844 99.1 F (37.3 C)     Temp Source 05/08/21 0844 Oral     SpO2 05/08/21 0844 96 %     Weight 05/08/21 0841 190 lb 0.6 oz (86.2 kg)     Height 05/08/21 0841 5\' 9"  (1.753 m)     Head Circumference --      Peak Flow --      Pain Score 05/08/21 0840 8     Pain Loc --      Pain Edu? --      Excl. in [place]? --    No data found.  Updated Vital Signs BP (!) 166/71 (BP Location: Right Arm)    Pulse 82    Temp 99.1 F (37.3 C) (Oral)    Resp 18    Ht 5\' 9"  (1.753 m)    Wt 190 lb 0.6 oz (86.2 kg)    SpO2 96%    BMI 28.06 kg/m   Visual Acuity Right Eye Distance:   Left Eye Distance:   Bilateral Distance:    Right Eye Near:   Left Eye Near:    Bilateral Near:     Physical Exam Constitutional:      Appearance: He is ill-appearing.  HENT:     Head: Normocephalic.     Right Ear: Tympanic membrane, ear canal and external ear normal.     Left Ear: Tympanic membrane, ear canal and external ear normal.     Nose: Congestion and rhinorrhea present.     Mouth/Throat:     Mouth: Mucous membranes are moist.     Pharynx: Posterior oropharyngeal erythema present.  Eyes:     Extraocular Movements: Extraocular movements intact.  Cardiovascular:     Rate and Rhythm: Normal rate and regular rhythm.     Pulses: Normal pulses.     Heart sounds: Normal heart sounds.  Pulmonary:     Effort: Pulmonary effort is normal.     Breath sounds: Wheezing present.  Musculoskeletal:     Cervical back: Normal range of motion and neck supple.  Skin:     General: Skin is warm and dry.  Neurological:     Mental Status: He is alert and oriented to person, place, and time. Mental status is at baseline.  Psychiatric:        Mood and Affect: Mood normal.     UC Treatments / Results  Labs (all labs ordered are listed, but only abnormal results are displayed) Labs Reviewed - No data to display  EKG   Radiology No  results found.  Procedures Procedures (including critical care time)  Medications Ordered in UC Medications - No data to display  Initial Impression / Assessment and Plan / UC Course  I have reviewed the triage vital signs and the nursing notes.  Pertinent labs & imaging results that were available during my care of the patient were reviewed by me and considered in my medical decision making (see chart for details).  Acute bronchitis  Etiology of symptoms are most likely viral, discussed with patient, vital signs are stable with a low-grade fever of 99.1, while patient ill-appearing, he is in no signs of distress, will move forward with treatment for bronchitis without imaging as symptoms are most likely exacerbated by tobacco use, prednisone 5-day burst prescribed with albuterol inhaler for management of wheezing and shortness of breath Tessalon and Promethazine DM prescribed for management of coughing, patient may continue use of over-the-counter medications in addition for supportive care, urgent care follow-up as needed Final Clinical Impressions(s) / UC Diagnoses   Final diagnoses:  None   Discharge Instructions   None    ED Prescriptions   None    PDMP not reviewed this encounter.   Hans [place], [place] 05/08/21 [phone]

## 2021-05-08 NOTE — ED Triage Notes (Signed)
Pt c/o cough, nasal congestion, runny nose, body aches, chills and sweats. Started about 2 days ago. He states the cough is keeping him up at night. Denies fever.

## 2021-05-08 NOTE — Discharge Instructions (Signed)
Acute bronchitis is when air tubes in the lungs (bronchi) suddenly get swollen. The condition can make it hard for you to breathe.  This is most likely being caused by a virus which will resolve on its own time, treatment today will be aimed at reducing the swelling.   Take prednisone every morning with food for 5 days  You may use albuterol inhaler taking 2 puffs every 4-6 hours for wheezing or shortness of breath  You may use Tessalon pill 1-2 every 8 hours as needed to help calm coughing  You may use cough syrup every 6 hours to help with coughing, be mindful this medication may make you drowsy, if this occurs you may use at bedtime  You may attempt the following below in addition You may attempt use of over-the-counter medications in addition Use a vaporizer, or humidifier at bedtime to help moisten the air Take two teaspoons (10 mL) of honey at bedtime. This helps lessen your coughing at night. Drink enough fluid to keep your pee (urine) pale yellow. Do not smoke or use any products that contain nicotine or tobacco while ill, this will further irritate your airways Get a lot of rest.

## 2021-07-04 ENCOUNTER — Ambulatory Visit
Admission: RE | Admit: 2021-07-04 | Discharge: 2021-07-04 | Disposition: A | Discharge status: Home / Self Care | Payer: Managed Care, Other (non HMO) | Source: Ambulatory Visit | Attending: Family Medicine | Admitting: Family Medicine

## 2021-07-04 ENCOUNTER — Encounter: Payer: Self-pay | Admitting: Family Medicine

## 2021-07-04 ENCOUNTER — Ambulatory Visit
Admission: RE | Admit: 2021-07-04 | Discharge: 2021-07-04 | Disposition: A | Discharge status: Home / Self Care | Payer: Managed Care, Other (non HMO) | Attending: Family Medicine | Admitting: Family Medicine

## 2021-07-04 ENCOUNTER — Ambulatory Visit: Payer: Managed Care, Other (non HMO) | Admitting: Family Medicine

## 2021-07-04 VITALS — BP 138/80 | HR 88 | Ht 69.0 in | Wt 205.0 lb

## 2021-07-04 DIAGNOSIS — S161XXA Strain of muscle, fascia and tendon at neck level, initial encounter: Secondary | ICD-10-CM | POA: Insufficient documentation

## 2021-07-04 DIAGNOSIS — R202 Paresthesia of skin: Secondary | ICD-10-CM | POA: Diagnosis present

## 2021-07-04 NOTE — Progress Notes (Signed)
? ? ?Date:  07/04/2021  ? ?Name:  Ethan George   DOB:  June 18, 1975   MRN:  [id number] ? ? ?Chief Complaint: Establish Care ? ?Patient is a 46 year old male who presents for a comprehensive physical exam. The patient reports the following problems: shoulder pain. Health maintenance has been reviewed up to date. ?  ? ?Shoulder Injury  ?The incident occurred at home. The left shoulder is affected. The incident occurred 5 to 7 days ago. The injury mechanism was a fall. The quality of the pain is described as aching. The pain radiates to the left arm. The pain is moderate. Associated symptoms include numbness and tingling. Nothing aggravates the symptoms. Treatments tried: prednisone. The treatment provided mild relief.  ? ?Lab Results  ?Component Value Date  ? NA 139 10/28/2017  ? K 3.5 10/28/2017  ? CO2 25 10/28/2017  ? GLUCOSE 146 (H) 10/28/2017  ? BUN 13 10/28/2017  ? CREATININE 0.79 10/28/2017  ? CALCIUM 8.8 (L) 10/28/2017  ? GFRNONAA >60 10/28/2017  ? ?Lab Results  ?Component Value Date  ? CHOL 225 (H) 01/25/2015  ? HDL 34 (L) 01/25/2015  ? LDLCALC 137 (H) 01/25/2015  ? TRIG 271 (H) 01/25/2015  ? ?Lab Results  ?Component Value Date  ? TSH 1.510 01/25/2015  ? ?No results found for: HGBA1C ?Lab Results  ?Component Value Date  ? WBC 8.7 10/28/2017  ? HGB 14.5 10/28/2017  ? HCT 41.8 10/28/2017  ? MCV 94.3 10/28/2017  ? PLT 282 10/28/2017  ? ?Lab Results  ?Component Value Date  ? ALT 36 10/28/2017  ? AST 27 10/28/2017  ? ALKPHOS 79 10/28/2017  ? BILITOT 0.8 10/28/2017  ? ?No results found for: 25OHVITD2, 25OHVITD3, VD25OH  ? ?Review of Systems  ?Musculoskeletal:  Positive for myalgias and neck pain.  ?Neurological:  Positive for tingling and numbness.  ? ?Patient Active Problem List  ? Diagnosis Date Noted  ? Sprain of foot 12/29/2018  ? Cellulitis and abscess of left leg 12/31/2016  ? GERD (gastroesophageal reflux disease) 12/31/2016  ? Gout   ? 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 Current tobacco use  10/01/2014  ? ? ?Allergies  ?Allergen Reactions  ? Tramadol Hives and Itching  ? ? ?Past Surgical History:  ?Procedure Laterality Date  ? BACK SURGERY  2012,2016  ? X 2  ? I & D EXTREMITY Left 10/27/2019  ? Procedure: Left open infrapatellar bursa irrigation and debridement;  Surgeon: Signa Kell, MD;  Location: [facility];  Service: Orthopedics;  Laterality: Left;  ? KNEE ARTHROSCOPY Left 10/27/2019  ? Procedure: ARTHROSCOPY KNEE;  Surgeon: Signa Kell, MD;  Location: [facility];  Service: Orthopedics;  Laterality: Left;  ? KNEE ARTHROSCOPY WITH SUBCHONDROPLASTY Left 09/28/2019  ? Procedure: Left Knee open irrigation and debridement of the infrapatellar bursa.;  Surgeon: Signa Kell, MD;  Location: [facility];  Service: Orthopedics;  Laterality: Left;  ? ? ?Social History  ? ?Tobacco Use  ? Smoking status: Every Day  ?  Packs/day: 1.50  ?  Years: 30.00  ?  Pack years: 45.00  ?  Types: Cigarettes  ? Smokeless tobacco: Never  ?Vaping Use  ? Vaping Use: Never used  ?Substance Use Topics  ? Alcohol use: Yes  ?  Comment: occasionally  ? Drug use: No  ? ? ? ?Medication list has been reviewed and updated. ? ?Current Meds  ?Medication Sig  ? predniSONE (STERAPRED UNI-PAK 21 TAB) 10 MG (21) TBPK tablet 12 day  taper. Take as directed with food  ? ? ?   ? View : No data to display.  ?  ?  ?  ? ? ? ?  01/25/2015  ?  9:06 AM  ?Depression screen PHQ 2/9  ?Decreased Interest 0  ?Down, Depressed, Hopeless 0  ?PHQ - 2 Score 0  ? ? ?BP Readings from Last 3 Encounters:  ?07/04/21 138/80  ?05/08/21 (!) 166/71  ?01/18/21 (!) 142/88  ? ? ?Physical Exam ?Vitals and nursing note reviewed.  ?HENT:  ?   Head: Normocephalic.  ?   Right Ear: External ear normal.  ?   Left Ear: External ear normal.  ?   Nose: Nose normal.  ?Eyes:  ?   General: No scleral icterus.    ?   Right eye: No discharge.     ?   Left eye: No discharge.  ?   Conjunctiva/sclera: Conjunctivae normal.  ?   Pupils: Pupils are equal, round, and reactive to light.  ?Neck:  ?    Thyroid: No thyromegaly.  ?   Vascular: No JVD.  ?   Trachea: No tracheal deviation.  ?Cardiovascular:  ?   Rate and Rhythm: Normal rate and regular rhythm.  ?   Heart sounds: Normal heart sounds. No murmur heard. ?  No friction rub. No gallop.  ?Pulmonary:  ?   Effort: No respiratory distress.  ?   Breath sounds: Normal breath sounds. No wheezing or rales.  ?Abdominal:  ?   General: Bowel sounds are normal.  ?   Palpations: Abdomen is soft. There is no mass.  ?   Tenderness: There is no abdominal tenderness. There is no guarding or rebound.  ?Musculoskeletal:     ?   General: Tenderness present. Normal range of motion.  ?   Cervical back: Normal range of motion and neck supple.  ?Lymphadenopathy:  ?   Cervical: No cervical adenopathy.  ?Neurological:  ?   Mental Status: He is alert.  ?   Cranial Nerves: No cranial nerve deficit.  ?   Sensory: Sensation is intact.  ?   Motor: Motor function is intact.  ?   Deep Tendon Reflexes:  ?   Reflex Scores: ?     Tricep reflexes are 1+ on the left side. ?     Bicep reflexes are 1+ on the left side. ?     Brachioradialis reflexes are 1+ on the left side. ? ? ?Wt Readings from Last 3 Encounters:  ?07/04/21 205 lb (93 kg)  ?05/08/21 190 lb 0.6 oz (86.2 kg)  ?06/15/20 190 lb (86.2 kg)  ? ? ?BP 138/80   Pulse 88   Ht 5\' 9"  (1.753 m)   Wt 205 lb (93 kg)   SpO2 99%   BMI 30.27 kg/m?  ? ?Assessment and Plan: ?Patient establishing care with new physician ? ?1. Arm paresthesia, left ?Following the injury due to a fall precipitated by a [employer] pulling him down to the floor crashing into some hard shelving area sustaining a crush like injury at the left base of the neck.  Initially some shoulder pain but that has resolved but now he is having paresthesias with some weakness perceived by the patient to his left arm/hand. ?- DG Cervical Spine Complete; Future ? ?2. Strain of cervical portion of left trapezius muscle ?New onset.  Persistent.  Examination notes minimal  tenderness and if any is at the left base of the trapezius and suprascapular.  Neurologic exam is  normal.  We will obtain a C-spine to see if there is any transverse process fractures or involvement of the exit of the nerve roots as well as encouraged patient to initiate his ibuprofen 800 mg 3 times a day.  We will obtain a sports medicine consult with Dr. Ashley Royalty. ?- DG Cervical Spine Complete; Future  ? ?

## 2021-07-06 ENCOUNTER — Ambulatory Visit: Payer: Managed Care, Other (non HMO) | Admitting: Family Medicine

## 2021-07-06 ENCOUNTER — Encounter: Payer: Self-pay | Admitting: Family Medicine

## 2021-07-06 VITALS — BP 136/78 | HR 88 | Ht 69.0 in | Wt 212.0 lb

## 2021-07-06 DIAGNOSIS — M4722 Other spondylosis with radiculopathy, cervical region: Secondary | ICD-10-CM | POA: Insufficient documentation

## 2021-07-06 DIAGNOSIS — M7542 Impingement syndrome of left shoulder: Secondary | ICD-10-CM | POA: Diagnosis not present

## 2021-07-06 MED ORDER — DICLOFENAC SODIUM 75 MG PO TBEC: 75.0000 mg | DELAYED_RELEASE_TABLET | Freq: Two times a day (BID) | ORAL | 0 refills | Status: DC

## 2021-07-06 MED ORDER — METHOCARBAMOL 750 MG PO TABS: 750.0000 mg | ORAL_TABLET | Freq: Three times a day (TID) | ORAL | 0 refills | Status: DC | PRN

## 2021-07-06 MED ORDER — GABAPENTIN 300 MG PO CAPS: 300.0000 mg | ORAL_CAPSULE | Freq: Every day | ORAL | 0 refills | Status: DC

## 2021-07-06 MED ORDER — PREDNISONE 50 MG PO TABS: 50.0000 mg | ORAL_TABLET | Freq: Every day | ORAL | 0 refills | Status: DC
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
## 2021-07-06 NOTE — Patient Instructions (Signed)
-   Take prednisone new Rx x5-day course ?- Can dose OTC ibuprofen, up to 800 mg every 8 hours with food ?- After 5 days, discontinue prednisone and ibuprofen, start diclofenac every a.m. and every p.m. with food ?- Dose nightly gabapentin ?- Can dose Robaxin (methocarbamol) every 8 hours on as-needed basis, side effect and be drowsiness ?- Hold from strenuous activity until return ?- Return for follow-up in 2 weeks ?

## 2021-07-14 ENCOUNTER — Ambulatory Visit: Payer: Managed Care, Other (non HMO) | Admitting: Family Medicine

## 2021-07-14 ENCOUNTER — Inpatient Hospital Stay (INDEPENDENT_AMBULATORY_CARE_PROVIDER_SITE_OTHER): Payer: Managed Care, Other (non HMO) | Admitting: Radiology

## 2021-07-14 ENCOUNTER — Encounter: Payer: Self-pay | Admitting: Family Medicine

## 2021-07-14 VITALS — BP 142/90 | HR 88 | Ht 69.0 in | Wt 208.0 lb

## 2021-07-14 DIAGNOSIS — M4722 Other spondylosis with radiculopathy, cervical region: Secondary | ICD-10-CM | POA: Diagnosis not present

## 2021-07-14 DIAGNOSIS — M7542 Impingement syndrome of left shoulder: Secondary | ICD-10-CM

## 2021-07-14 MED ORDER — TRIAMCINOLONE ACETONIDE 40 MG/ML IJ SUSP
40.0000 mg | Freq: Once | INTRAMUSCULAR | Status: AC
  Administered 2021-07-14: 40 mg via INTRAMUSCULAR

## 2021-07-14 MED ORDER — GABAPENTIN 300 MG PO CAPS: 300.0000 mg | ORAL_CAPSULE | Freq: Three times a day (TID) | ORAL | 0 refills | Status: DC

## 2021-07-14 MED ORDER — DICLOFENAC SODIUM ER 100 MG PO TB24: 100.0000 mg | ORAL_TABLET | Freq: Two times a day (BID) | ORAL | 0 refills | Status: AC

## 2021-07-14 NOTE — Patient Instructions (Signed)
You have just been given a cortisone injection to reduce pain and inflammation. After the injection you may notice immediate relief of pain as a result of the Lidocaine. It is important to rest the area of the injection for 24 to 48 hours after the injection. There is a possibility of some temporary increased discomfort and swelling for up to 72 hours until the cortisone begins to work. If you do have pain, simply rest the joint and use ice. If you can tolerate over the counter medications, you can try Tylenol for added relief per package instructions. ?- Increase gabapentin to three times a day ?- Dose new strength of diclofenac twice daily (take with food) ?- Can continue muscle laxer on as-needed basis ?- Work restrictions note provided today ?- Referral coordinator will contact in regards to scheduling visits with physical therapy, spine group, and scheduling MRI ?- Return for follow-up in 1 month ? ?
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
## 2021-07-14 NOTE — Assessment & Plan Note (Signed)
Patient with persistent symptomatology in the neck/shoulder region despite pharmacotherapy inclusive of prednisone, methocarbamol, diclofenac, and gabapentin performed at last visit.  Examination does show stable findings with focality to the supraspinatus where there is impingement.  Given his comorbid cervical spine pain, in an effort to ascertain the degree of involvement of the neck versus shoulder he did elect to proceed with ultrasound-guided diagnostic/therapeutic corticosteroid injection to the left subacromial space.  Following this he did note minor improvement in shoulder pain however persistent paresthesias primarily with overhead activities.  I have advised to start a physical therapy, increasing diclofenac, and as needed methocarbamol.  He will return for follow-up in 4 weeks for reevaluation. ?
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
## 2021-07-24 ENCOUNTER — Ambulatory Visit: Payer: Self-pay | Admitting: *Deleted

## 2021-07-24 ENCOUNTER — Encounter: Payer: Self-pay | Admitting: Family Medicine

## 2021-07-24 ENCOUNTER — Ambulatory Visit: Payer: Managed Care, Other (non HMO) | Admitting: Family Medicine

## 2021-07-24 VITALS — BP 142/92 | HR 100 | Ht 69.0 in | Wt 200.0 lb

## 2021-07-24 DIAGNOSIS — M4722 Other spondylosis with radiculopathy, cervical region: Secondary | ICD-10-CM

## 2021-07-24 MED ORDER — GABAPENTIN 100 MG PO CAPS: 100.0000 mg | ORAL_CAPSULE | Freq: Three times a day (TID) | ORAL | 0 refills | Status: DC

## 2021-07-24 MED ORDER — PREDNISONE 20 MG PO TABS: 20.0000 mg | ORAL_TABLET | Freq: Every day | ORAL | 0 refills | Status: DC

## 2021-07-24 MED ORDER — HYDROCODONE-ACETAMINOPHEN 5-325 MG PO TABS: 1.0000 | ORAL_TABLET | Freq: Three times a day (TID) | ORAL | 0 refills | Status: AC | PRN

## 2021-07-24 NOTE — Telephone Encounter (Signed)
?  Chief Complaint: pain- injection didn't last, pain medication not helping ?Symptoms: shoulder blade pain- to elbow ?Frequency: ongoing ?Pertinent Negatives: Patient denies   ?Disposition: [] ED /[] Urgent Care (no appt availability in office) / [x] Appointment(In office/virtual)/ []  [facility]/ [] Home Care/ [] Refused Recommended Disposition /[] [facility]/ []  Follow-up with PCP ?Additional Notes:    ?

## 2021-07-24 NOTE — Telephone Encounter (Signed)
Summary: not sleep-medication not working  ? Pts wife called in stating pt was recently put onm Gabapentin, but stating its still not working, and he is still not able to sleep, pt requesting a call back to see what all he can do to help, please advise.   ?  ? ?Reason for Disposition ? [1] Caller has URGENT medicine question about med that PCP or specialist prescribed AND [2] triager unable to answer question ? ?Answer Assessment - Initial Assessment Questions ?1. NAME of MEDICATION: "What medicine are you calling about?" ?    Gabapentin 3 times/day- not helping pain , pain feels like it is getting owrse.  ?2. QUESTION: "What is your question?" (e.g., double dose of medicine, side effect) ?    Injection - helped some that day- next day symptoms returned ?3. PRESCRIBING HCP: "Who prescribed it?" Reason: if prescribed by specialist, call should be referred to that group. ?    Ashley Royalty ?4. SYMPTOMS: "Do you have any symptoms?" ?    Pain in the shoulder blade and arm-elbow pain ?5. SEVERITY: If symptoms are present, ask "Are they mild, moderate or severe?" ?    Severe- not able to sleep ay night ? ?Protocols used: Medication Question Call-A-AH ? ?
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
## 2021-07-26 ENCOUNTER — Ambulatory Visit: Payer: Managed Care, Other (non HMO) | Attending: Family Medicine | Admitting: Physical Therapy

## 2021-07-26 DIAGNOSIS — M7542 Impingement syndrome of left shoulder: Secondary | ICD-10-CM | POA: Diagnosis present

## 2021-07-26 DIAGNOSIS — M436 Torticollis: Secondary | ICD-10-CM | POA: Insufficient documentation

## 2021-07-26 DIAGNOSIS — M4722 Other spondylosis with radiculopathy, cervical region: Secondary | ICD-10-CM | POA: Diagnosis present

## 2021-07-26 NOTE — Patient Instructions (Signed)
Access Code: [email or web address] ?URL: [email or web address] ?Date: 07/26/2021 ?Prepared by: Dorene Grebe ? ?Exercises ?- Upper Trapezius Stretch  - 1 x daily - 7 x weekly - 1 sets - 3 reps - 20 seconds hold ?- Standing Cervical Retraction  - 1 x daily - 7 x weekly - 1 sets - 5 reps - 5 seconds hold ?- Seated Scapular Retraction  - 1 x daily - 7 x weekly - 1 sets - 10 reps ?- Doorway Pec Stretch at 90 Degrees Abduction  - 1 x daily - 7 x weekly - 1 sets - 3 reps - 10 seconds hold ?

## 2021-07-29 NOTE — Therapy (Deleted)
[facility] [address] [place], [place], [address] Phone: [phone]   Fax:  [phone]  Physical Therapy Evaluation  Patient Details  Name: Ethan George MRN: [id number] Date of Birth: 06-Jun-1975 No data recorded  Encounter Date: 07/26/2021   PT End of Session - 07/29/21 1722     Visit Number 1    Number of Visits 12    Date for PT Re-Evaluation 09/06/21    PT Start Time 0851    PT Stop Time 0946    PT Time Calculation (min) 55 min    Activity Tolerance Patient tolerated treatment well             Past Medical History:  Diagnosis Date   Chronic bronchitis (HCC)    GERD (gastroesophageal reflux disease)    Gout     Past Surgical History:  Procedure Laterality Date   BACK SURGERY  2012,2016   X 2   I & D EXTREMITY Left 10/27/2019   Procedure: Left open infrapatellar bursa irrigation and debridement;  Surgeon: Signa Kell, MD;  Location: [facility] ORS;  Service: Orthopedics;  Laterality: Left;   KNEE ARTHROSCOPY Left 10/27/2019   Procedure: ARTHROSCOPY KNEE;  Surgeon: Signa Kell, MD;  Location: [facility] ORS;  Service: Orthopedics;  Laterality: Left;   KNEE ARTHROSCOPY WITH SUBCHONDROPLASTY Left 09/28/2019   Procedure: Left Knee open irrigation and debridement of the infrapatellar bursa.;  Surgeon: Signa Kell, MD;  Location: [facility] ORS;  Service: Orthopedics;  Laterality: Left;    There were no vitals filed for this visit.                    Objective measurements completed on examination: See above findings.                              Patient will benefit from skilled therapeutic intervention in order to improve the following deficits and impairments:     Visit Diagnosis: Cervical spondylosis with radiculopathy  Impingement syndrome of left shoulder  Neck stiffness     Problem List Patient Active Problem List   Diagnosis Date Noted   Cervical spondylosis with  radiculopathy 07/06/2021   Impingement syndrome of left shoulder 07/06/2021   Gout 06/02/2019   Sprain of foot 12/29/2018   Strain of knee 01/24/2017   Cellulitis and abscess of left leg 12/31/2016   GERD (gastroesophageal reflux disease) 12/31/2016   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 [place], [place], [address] Phone: [phone]   Fax:  [phone]  Name: Ethan George MRN: [id number] Date of Birth: Nov 18, 1975

## 2021-07-31 ENCOUNTER — Ambulatory Visit: Payer: Managed Care, Other (non HMO) | Admitting: Physical Therapy

## 2021-07-31 DIAGNOSIS — M4722 Other spondylosis with radiculopathy, cervical region: Secondary | ICD-10-CM | POA: Diagnosis not present

## 2021-07-31 DIAGNOSIS — M436 Torticollis: Secondary | ICD-10-CM

## 2021-07-31 DIAGNOSIS — M7542 Impingement syndrome of left shoulder: Secondary | ICD-10-CM

## 2021-08-03 ENCOUNTER — Encounter: Payer: Managed Care, Other (non HMO) | Admitting: Physical Therapy
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
## 2021-08-07 NOTE — Addendum Note (Signed)
Addended by: Cammie Mcgee on: 08/07/2021 10:44 AM   Modules accepted: Orders
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
## 2021-08-08 ENCOUNTER — Encounter: Payer: Managed Care, Other (non HMO) | Admitting: Physical Therapy

## 2021-08-10 ENCOUNTER — Encounter: Payer: Managed Care, Other (non HMO) | Admitting: Physical Therapy

## 2021-08-15 ENCOUNTER — Ambulatory Visit: Payer: Managed Care, Other (non HMO) | Admitting: Family Medicine

## 2021-08-15 ENCOUNTER — Encounter: Payer: Managed Care, Other (non HMO) | Admitting: Physical Therapy

## 2021-08-17 ENCOUNTER — Encounter: Payer: Managed Care, Other (non HMO) | Admitting: Physical Therapy

## 2021-08-25 ENCOUNTER — Ambulatory Visit (INDEPENDENT_AMBULATORY_CARE_PROVIDER_SITE_OTHER): Payer: Managed Care, Other (non HMO)

## 2021-08-25 ENCOUNTER — Ambulatory Visit
Admission: EM | Admit: 2021-08-25 | Discharge: 2021-08-25 | Disposition: A | Discharge status: Home / Self Care | Payer: Managed Care, Other (non HMO) | Attending: Emergency Medicine | Admitting: Emergency Medicine

## 2021-08-25 DIAGNOSIS — R059 Cough, unspecified: Secondary | ICD-10-CM | POA: Diagnosis not present

## 2021-08-25 DIAGNOSIS — Z20822 Contact with and (suspected) exposure to covid-19: Secondary | ICD-10-CM | POA: Diagnosis not present

## 2021-08-25 DIAGNOSIS — Z28310 Unvaccinated for covid-19: Secondary | ICD-10-CM | POA: Diagnosis not present

## 2021-08-25 DIAGNOSIS — R509 Fever, unspecified: Secondary | ICD-10-CM | POA: Insufficient documentation

## 2021-08-25 DIAGNOSIS — R062 Wheezing: Secondary | ICD-10-CM | POA: Diagnosis not present

## 2021-08-25 DIAGNOSIS — J4521 Mild intermittent asthma with (acute) exacerbation: Secondary | ICD-10-CM | POA: Insufficient documentation

## 2021-08-25 DIAGNOSIS — F17210 Nicotine dependence, cigarettes, uncomplicated: Secondary | ICD-10-CM | POA: Diagnosis not present

## 2021-08-25 DIAGNOSIS — Z8616 Personal history of COVID-19: Secondary | ICD-10-CM | POA: Insufficient documentation

## 2021-08-25 DIAGNOSIS — R0602 Shortness of breath: Secondary | ICD-10-CM | POA: Diagnosis not present

## 2021-08-25 DIAGNOSIS — J069 Acute upper respiratory infection, unspecified: Secondary | ICD-10-CM | POA: Insufficient documentation

## 2021-08-25 DIAGNOSIS — J449 Chronic obstructive pulmonary disease, unspecified: Secondary | ICD-10-CM | POA: Diagnosis not present

## 2021-08-25 LAB — SARS CORONAVIRUS 2 BY RT PCR: SARS Coronavirus 2 by RT PCR: NEGATIVE

## 2021-08-25 MED ORDER — IPRATROPIUM-ALBUTEROL 0.5-2.5 (3) MG/3ML IN SOLN
3.0000 mL | Freq: Once | RESPIRATORY_TRACT | Status: AC
Start: 2021-08-25 — End: 2021-08-25
  Administered 2021-08-25: 3 mL via RESPIRATORY_TRACT

## 2021-08-25 MED ORDER — PREDNISONE 20 MG PO TABS: 40.0000 mg | ORAL_TABLET | Freq: Every day | ORAL | 0 refills | Status: AC

## 2021-08-25 MED ORDER — FLUTICASONE PROPIONATE 50 MCG/ACT NA SUSP: 2.0000 | Freq: Every day | NASAL | 0 refills | Status: DC

## 2021-08-25 MED ORDER — AEROCHAMBER MV MISC: 1 refills | Status: DC

## 2021-08-25 MED ORDER — PROMETHAZINE-DM 6.25-15 MG/5ML PO SYRP: 5.0000 mL | ORAL_SOLUTION | Freq: Four times a day (QID) | ORAL | 0 refills | Status: DC | PRN

## 2021-08-25 MED ORDER — ALBUTEROL SULFATE HFA 108 (90 BASE) MCG/ACT IN AERS: 1.0000 | INHALATION_SPRAY | RESPIRATORY_TRACT | 0 refills | Status: DC | PRN

## 2021-08-25 NOTE — ED Triage Notes (Signed)
Pt presents with cough, body aches, fever/chills x 2 days.  States someone at work had bronchitis.  Mucinex cough and cold with no improvement.

## 2021-08-25 NOTE — ED Provider Notes (Signed)
HPI  SUBJECTIVE:  Ethan George is a 46 y.o. male who presents with 2 days of feeling feverish, headache, nasal congestion, green rhinorrhea in the morning that becomes clear during the day, chest congestion, sinus pressure, postnasal drip, nasal congestion, sore throat secondary to cough, loss of smell and taste, wheezing, shortness of breath, dyspnea on exertion.  He is unable to sleep at night secondary to the cough.  No body aches, facial swelling, upper dental pain, nausea, vomiting, diarrhea, abdominal pain.  He has a sick coworker who was diagnosed with bronchitis, but is not sure if his coworker was tested for COVID.  No known COVID exposure.  He did not get the COVID-vaccine.  No antibiotics in the past month.  No antipyretic in the past 6 hours.  He has tried Mucinex and Mucinex cough drops without improvement of symptoms.  Symptoms are worse with lying down, exertion.  He has a past medical history of asthma, is a smoker, and COVID in 2020.  PCP: [facility].    Past Medical History:  Diagnosis Date   Chronic bronchitis (HCC)    GERD (gastroesophageal reflux disease)    Gout     Past Surgical History:  Procedure Laterality Date   BACK SURGERY  2012,2016   X 2   I & D EXTREMITY Left 10/27/2019   Procedure: Left open infrapatellar bursa irrigation and debridement;  Surgeon: Leim Fabry, MD;  Location: [facility];  Service: Orthopedics;  Laterality: Left;   KNEE ARTHROSCOPY Left 10/27/2019   Procedure: ARTHROSCOPY KNEE;  Surgeon: Leim Fabry, MD;  Location: [facility];  Service: Orthopedics;  Laterality: Left;   KNEE ARTHROSCOPY WITH SUBCHONDROPLASTY Left 09/28/2019   Procedure: Left Knee open irrigation and debridement of the infrapatellar bursa.;  Surgeon: Leim Fabry, MD;  Location: [facility];  Service: Orthopedics;  Laterality: Left;    Family History  Problem Relation Age of Onset   Stroke Father    Hypertension Father    Heart attack Maternal Grandfather     Social  History   Tobacco Use   Smoking status: Every Day    Packs/day: 1.50    Years: 30.00    Total pack years: 45.00    Types: Cigarettes   Smokeless tobacco: Never  Vaping Use   Vaping Use: Never used  Substance Use Topics   Alcohol use: Yes    Comment: occasionally   Drug use: No    No current facility-administered medications for this encounter.  Current Outpatient Medications:    albuterol (VENTOLIN HFA) 108 (90 Base) MCG/ACT inhaler, Inhale 1-2 puffs into the lungs every 4 (four) hours as needed for wheezing or shortness of breath., Disp: 1 each, Rfl: 0   fluticasone (FLONASE) 50 MCG/ACT nasal spray, Place 2 sprays into both nostrils daily., Disp: 16 g, Rfl: 0   predniSONE (DELTASONE) 20 MG tablet, Take 2 tablets (40 mg total) by mouth daily with breakfast for 5 days., Disp: 10 tablet, Rfl: 0   promethazine-dextromethorphan (PROMETHAZINE-DM) 6.25-15 MG/5ML syrup, Take 5 mLs by mouth 4 (four) times daily as needed for cough., Disp: 118 mL, Rfl: 0   Spacer/Aero-Holding Chambers (AEROCHAMBER MV) inhaler, Use as instructed, Disp: 1 each, Rfl: 1   gabapentin (NEURONTIN) 100 MG capsule, Take 1 capsule (100 mg total) by mouth 3 (three) times daily., Disp: 90 capsule, Rfl: 0   gabapentin (NEURONTIN) 300 MG capsule, Take 1 capsule (300 mg total) by mouth 3 (three) times daily., Disp: 90 capsule, Rfl: 0  methocarbamol (ROBAXIN-750) 750 MG tablet, Take 1 tablet (750 mg total) by mouth 3 (three) times daily as needed for muscle spasms., Disp: 45 tablet, Rfl: 0  Allergies  Allergen Reactions   Tramadol Hives and Itching     ROS  As noted in HPI.   Physical Exam  BP (!) 133/101 (BP Location: Left Arm)   Pulse 90   Temp 97.9 F (36.6 C) (Oral)   Resp 20   SpO2 98%   Constitutional: Well developed, well nourished, no acute distress.  Coughing. Eyes:  EOMI, conjunctiva normal bilaterally HENT: Normocephalic, atraumatic,mucus membranes moist.  Positive clear nasal congestion.   Swollen, erythematous turbinates.  No maxillary, frontal sinus tenderness.  Erythematous oropharynx. Neck: No cervical lymphadenopathy Respiratory: Normal inspiratory effort, wheeze throughout all lung fields, fair air movement.  Positive anterior, lateral chest wall tenderness Cardiovascular: Normal rate, regular rhythm, no murmurs rubs or gallops GI: nondistended skin: No rash, skin intact Musculoskeletal: no deformities Neurologic: Alert & oriented x 3, no focal neuro deficits Psychiatric: Speech and behavior appropriate   ED Course   Medications  ipratropium-albuterol (DUONEB) 0.5-2.5 (3) MG/3ML nebulizer solution 3 mL (3 mLs Nebulization Given 08/25/21 0928)    Orders Placed This Encounter  Procedures   SARS Coronavirus 2 by RT PCR (hospital order, performed in [facility] hospital lab) *cepheid single result test* Anterior Nasal Swab    Standing Status:   Standing    Number of Occurrences:   1    Order Specific Question:   Patient immune status    Answer:   Normal    Order Specific Question:   Release to patient    Answer:   Immediate   DG Chest 2 View    Standing Status:   Standing    Number of Occurrences:   1    Order Specific Question:   Reason for Exam (SYMPTOM  OR DIAGNOSIS REQUIRED)    Answer:   Cough, wheeze, shortness of breath, rule out pneumonia    Results for orders placed or performed during the hospital encounter of 08/25/21 (from the past 24 hour(s))  SARS Coronavirus 2 by RT PCR (hospital order, performed in [facility] hospital lab) *cepheid single result test* Anterior Nasal Swab     Status: None   Collection Time: 08/25/21  9:10 AM   Specimen: Anterior Nasal Swab  Result Value Ref Range   SARS Coronavirus 2 by RT PCR NEGATIVE NEGATIVE   DG Chest 2 View  Result Date: 08/25/2021 CLINICAL DATA:  Cough and wheezing, shortness of breath EXAM: CHEST - 2 VIEW COMPARISON:  06/15/2020 FINDINGS: The heart size and mediastinal contours are within normal limits.  Both lungs are clear. The visualized skeletal structures are unremarkable. IMPRESSION: No active cardiopulmonary disease. Electronically Signed   By: Judie Petit.  Shick M.D.   On: 08/25/2021 10:02    ED Clinical Impression  1. Upper respiratory tract infection, unspecified type   2. Intermittent asthma with acute exacerbation, unspecified asthma severity      ED Assessment/Plan  Patient with diffuse wheezing, coughing, anterior and lateral chest wall tenderness.  Giving DuoNeb.  Suspect upper respiratory infection with asthma exacerbation.  Will check chest x-ray to rule out pneumonia.  Also COVID, will treat with antivirals if positive as he is unvaccinated.  Reviewed imaging independently.  No pneumonia.  See radiology report for full details.  X-ray negative for pneumonia.  COVID-negative.  On reevaluation, patient states that he feels better.  Continued wheezing on reexam, but improved  air movement.  will treat as a viral respiratory infection.  Mucinex D, saline nasal occasion, Flonase, Promethazine DM, regularly scheduled albuterol with a spacer for the next 4 days, then as needed, prednisone 40 mg for 5 days.  Follow-up with PCP if not getting any better in a week, sooner if he gets worse.  ER return precautions given.  Discussed labs, imaging, MDM, treatment plan, and plan for follow-up with patient. Discussed sn/sx that should prompt return to the ED. patient agrees with plan.   Meds ordered this encounter  Medications   ipratropium-albuterol (DUONEB) 0.5-2.5 (3) MG/3ML nebulizer solution 3 mL   albuterol (VENTOLIN HFA) 108 (90 Base) MCG/ACT inhaler    Sig: Inhale 1-2 puffs into the lungs every 4 (four) hours as needed for wheezing or shortness of breath.    Dispense:  1 each    Refill:  0   Spacer/Aero-Holding Chambers (AEROCHAMBER MV) inhaler    Sig: Use as instructed    Dispense:  1 each    Refill:  1   fluticasone (FLONASE) 50 MCG/ACT nasal spray    Sig: Place 2 sprays into both  nostrils daily.    Dispense:  16 g    Refill:  0   promethazine-dextromethorphan (PROMETHAZINE-DM) 6.25-15 MG/5ML syrup    Sig: Take 5 mLs by mouth 4 (four) times daily as needed for cough.    Dispense:  118 mL    Refill:  0   predniSONE (DELTASONE) 20 MG tablet    Sig: Take 2 tablets (40 mg total) by mouth daily with breakfast for 5 days.    Dispense:  10 tablet    Refill:  0      *This clinic note was created using Scientist, clinical (histocompatibility and immunogenetics). Therefore, there may be occasional mistakes despite careful proofreading.  ?    Domenick Gong, MD 08/25/21 1015

## 2021-08-25 NOTE — Discharge Instructions (Addendum)
Your COVID was negative and your x-ray was negative for pneumonia.  2 puffs from your albuterol inhaler using your spacer every 4 hours for 2 days, then every 6 hours for 2 days, then as needed.  You can back off on the albuterol if you start to improve sooner.  Mucinex D, saline nasal irrigation with a Lloyd Huger Med rinse and distilled water as often as you want, Flonase, prednisone.  Finish the prednisone, unless a healthcare provider tells you to stop.  Promethazine DM as needed for cough.  Go to the ED for worsening shortness of breath, or for any other concerns.

## 2022-01-16 ENCOUNTER — Ambulatory Visit (INDEPENDENT_AMBULATORY_CARE_PROVIDER_SITE_OTHER): Payer: Managed Care, Other (non HMO)

## 2022-01-16 ENCOUNTER — Ambulatory Visit
Admission: EM | Admit: 2022-01-16 | Discharge: 2022-01-16 | Disposition: A | Discharge status: Home / Self Care | Payer: Managed Care, Other (non HMO) | Attending: Physician Assistant | Admitting: Physician Assistant

## 2022-01-16 DIAGNOSIS — Z1152 Encounter for screening for COVID-19: Secondary | ICD-10-CM | POA: Insufficient documentation

## 2022-01-16 DIAGNOSIS — J209 Acute bronchitis, unspecified: Secondary | ICD-10-CM | POA: Diagnosis present

## 2022-01-16 DIAGNOSIS — J45901 Unspecified asthma with (acute) exacerbation: Secondary | ICD-10-CM | POA: Diagnosis present

## 2022-01-16 DIAGNOSIS — R0602 Shortness of breath: Secondary | ICD-10-CM

## 2022-01-16 DIAGNOSIS — M549 Dorsalgia, unspecified: Secondary | ICD-10-CM

## 2022-01-16 LAB — RESP PANEL BY RT-PCR (FLU A&B, COVID) ARPGX2
Influenza A by PCR: NEGATIVE
Influenza B by PCR: NEGATIVE
SARS Coronavirus 2 by RT PCR: NEGATIVE

## 2022-01-16 MED ORDER — PROMETHAZINE-DM 6.25-15 MG/5ML PO SYRP: 5.0000 mL | ORAL_SOLUTION | Freq: Four times a day (QID) | ORAL | 0 refills | Status: DC | PRN

## 2022-01-16 MED ORDER — PREDNISONE 20 MG PO TABS: 40.0000 mg | ORAL_TABLET | Freq: Every day | ORAL | 0 refills | Status: AC

## 2022-01-16 MED ORDER — ALBUTEROL SULFATE HFA 108 (90 BASE) MCG/ACT IN AERS: 1.0000 | INHALATION_SPRAY | Freq: Four times a day (QID) | RESPIRATORY_TRACT | 0 refills | Status: DC | PRN

## 2022-01-16 NOTE — ED Triage Notes (Signed)
Patient presents to UC for SOB, back pain, and body aches since Sunday. Treating with tylenol and ibuprofen.  Denies fever.

## 2022-01-16 NOTE — Discharge Instructions (Signed)
-  You are negative for flu and COVID.  Your x-ray does not show any evidence of pneumonia.  You likely have viral bronchitis.  I have sent prednisone to the pharmacy to help with shortness of breath and wheezing as well as an inhaler and a cough medication.  Increase rest and fluids.  Return if you develop a fever or worsening symptoms or if you are not feeling better after 2 weeks.

## 2022-01-16 NOTE — ED Provider Notes (Signed)
[facility]    CSN: [id number] Arrival date & time: 01/16/22  1020      History   Chief Complaint Chief Complaint  Patient presents with   Shortness of Breath   Back Pain   Generalized Body Aches    HPI Ethan George is a 46 y.o. male with history of asthma and tobacco abuse presenting for 2-day history of body aches, shortness of breath, fatigue, right-sided back pain and worsening of his chronic cough.  Cough is productive of yellowish sputum.  He reports that he has not had a fever.  He denies sinus pain, sore throat and no vomiting or diarrhea.  He denies any sick contacts.  Has been taking over-the-counter Tylenol and ibuprofen.  He does have an albuterol inhaler but says it usually expired.  He has used it but it has not really helped with his shortness of breath.  No other complaints.  HPI    Patient Active Problem List   Diagnosis Date Noted   Cervical spondylosis with radiculopathy 07/06/2021   Impingement syndrome of left shoulder 07/06/2021   Gout 06/02/2019   Sprain of foot 12/29/2018   Strain of knee 01/24/2017   Cellulitis and abscess of left leg 12/31/2016   GERD (gastroesophageal reflux disease) 12/31/2016   Bulge of lumbar disc without myelopathy 10/01/2014   Current tobacco use 10/01/2014    Past Surgical History:  Procedure Laterality Date   BACK SURGERY  2012,2016   X 2   I & D EXTREMITY Left 10/27/2019   Procedure: Left open infrapatellar bursa irrigation and debridement;  Surgeon: Signa Kell, MD;  Location: [facility];  Service: Orthopedics;  Laterality: Left;   KNEE ARTHROSCOPY Left 10/27/2019   Procedure: ARTHROSCOPY KNEE;  Surgeon: Signa Kell, MD;  Location: [facility];  Service: Orthopedics;  Laterality: Left;   KNEE ARTHROSCOPY WITH SUBCHONDROPLASTY Left 09/28/2019   Procedure: Left Knee open irrigation and debridement of the infrapatellar bursa.;  Surgeon: Signa Kell, MD;  Location: [facility];  Service: Orthopedics;  Laterality:  Left;       Home Medications    Prior to Admission medications   Medication Sig Start Date End Date Taking? Authorizing Provider  albuterol (VENTOLIN HFA) 108 (90 Base) MCG/ACT inhaler Inhale 1-2 puffs into the lungs every 6 (six) hours as needed for wheezing or shortness of breath. 01/16/22  Yes Shirlee Latch, PA-C  predniSONE (DELTASONE) 20 MG tablet Take 2 tablets (40 mg total) by mouth daily for 5 days. 01/16/22 01/21/22 Yes Eusebio Friendly B, PA-C  fluticasone (FLONASE) 50 MCG/ACT nasal spray Place 2 sprays into both nostrils daily. 08/25/21   Domenick Gong, MD  gabapentin (NEURONTIN) 100 MG capsule Take 1 capsule (100 mg total) by mouth 3 (three) times daily. 07/24/21   Jerrol Banana, MD  gabapentin (NEURONTIN) 300 MG capsule Take 1 capsule (300 mg total) by mouth 3 (three) times daily. 07/14/21 08/13/21  Jerrol Banana, MD  methocarbamol (ROBAXIN-750) 750 MG tablet Take 1 tablet (750 mg total) by mouth 3 (three) times daily as needed for muscle spasms. 07/06/21   Jerrol Banana, MD  promethazine-dextromethorphan (PROMETHAZINE-DM) 6.25-15 MG/5ML syrup Take 5 mLs by mouth 4 (four) times daily as needed for cough. 01/16/22   Shirlee Latch, PA-C  Spacer/Aero-Holding Chambers (AEROCHAMBER MV) inhaler Use as instructed 08/25/21   Domenick Gong, MD  Omeprazole Magnesium (PRILOSEC OTC PO) Take by mouth as needed.  03/23/19  [provider]    Family History Family  History  Problem Relation Age of Onset   Stroke Father    Hypertension Father    Heart attack Maternal Grandfather     Social History Social History   Tobacco Use   Smoking status: Every Day    Packs/day: 1.50    Years: 30.00    Total pack years: 45.00    Types: Cigarettes   Smokeless tobacco: Never  Vaping Use   Vaping Use: Never used  Substance Use Topics   Alcohol use: Yes    Comment: occasionally   Drug use: No     Allergies   Tramadol   Review of Systems Review of Systems   Constitutional:  Positive for fatigue. Negative for fever.  HENT:  Positive for congestion. Negative for rhinorrhea, sinus pressure, sinus pain and sore throat.   Respiratory:  Positive for cough and shortness of breath.   Cardiovascular:  Negative for chest pain.  Gastrointestinal:  Negative for abdominal pain, diarrhea, nausea and vomiting.  Musculoskeletal:  Positive for back pain and myalgias.  Neurological:  Negative for weakness, light-headedness and headaches.  Hematological:  Negative for adenopathy.     Physical Exam Triage Vital Signs ED Triage Vitals  Enc Vitals Group     BP      Pulse      Resp      Temp      Temp src      SpO2      Weight      Height      Head Circumference      Peak Flow      Pain Score      Pain Loc      Pain Edu?      Excl. in GC?    No data found.  Updated Vital Signs BP (!) 149/95 (BP Location: Right Arm)   Pulse 80   Temp 97.9 F (36.6 C) (Oral)   Resp 16   SpO2 95%       Physical Exam Vitals and nursing note reviewed.  Constitutional:      General: He is not in acute distress.    Appearance: Normal appearance. He is well-developed. He is not ill-appearing.  HENT:     Head: Normocephalic and atraumatic.     Nose: Nose normal.     Mouth/Throat:     Mouth: Mucous membranes are moist.     Pharynx: Oropharynx is clear. Posterior oropharyngeal erythema present.  Eyes:     General: No scleral icterus.    Conjunctiva/sclera: Conjunctivae normal.  Cardiovascular:     Rate and Rhythm: Normal rate and regular rhythm.     Heart sounds: Normal heart sounds.  Pulmonary:     Effort: Pulmonary effort is normal. No respiratory distress.     Breath sounds: Wheezing and rhonchi present.     Comments: Diffuse wheezing and rhonchi throughout lung fields Musculoskeletal:     Cervical back: Neck supple.  Skin:    General: Skin is warm and dry.     Capillary Refill: Capillary refill takes less than 2 seconds.  Neurological:      General: No focal deficit present.     Mental Status: He is alert. Mental status is at baseline.     Motor: No weakness.     Gait: Gait normal.  Psychiatric:        Mood and Affect: Mood normal.        Behavior: Behavior normal.      UC Treatments / Results  Labs (all labs ordered are listed, but only abnormal results are displayed) Labs Reviewed  RESP PANEL BY RT-PCR (FLU A&B, COVID) [id number]    EKG   Radiology DG Chest 2 View  Result Date: 01/16/2022 CLINICAL DATA:  Back pain, shortness of breath EXAM: CHEST - 2 VIEW COMPARISON:  08/25/2021 FINDINGS: The heart size and mediastinal contours are within normal limits. Both lungs are clear. The visualized skeletal structures are unremarkable. IMPRESSION: No active cardiopulmonary disease. Electronically Signed   By: Elmer Picker M.D.   On: 01/16/2022 11:13    Procedures Procedures (including critical care time)  Medications Ordered in UC Medications - No data to display  Initial Impression / Assessment and Plan / UC Course  I have reviewed the triage vital signs and the nursing notes.  Pertinent labs & imaging results that were available during my care of the patient were reviewed by me and considered in my medical decision making (see chart for details).   46 year old male presents for shortness of breath, back pain, productive cough and body aches.  Symptom onset 2 days ago.  Does report a chronic cough but is recently worsened.  He has a history of asthma and tobacco abuse.  No sick contacts.  He is presently afebrile and overall well-appearing.  In no acute distress.  Vitals are stable.  On exam he does have mild erythema posterior pharynx and diffuse wheezing and rhonchi throughout lung fields.  Respiratory panel obtained.  Also ordered chest x-ray to assess for possible pneumonia.  X-ray result is negative for any acute abnormality.  Respiratory panel is negative.  Chest x-ray without evidence of pneumonia which I  discussed with patient.  Suspect viral bronchitis and flareup of his underlying asthma.  Sent prednisone to pharmacy and refilled the albuterol inhaler as well as sent Promethazine DM.  Encouraged plenty rest and fluids and asked him to return if he is not feeling better in the next couple weeks or if she develops a fever, worsening cough or increased shortness of breath.  He knows to go to the emergency department for any acute worsening of his symptoms.   Final Clinical Impressions(s) / UC Diagnoses   Final diagnoses:  Acute bronchitis, unspecified organism  Asthma with acute exacerbation, unspecified asthma severity, unspecified whether persistent  Shortness of breath     Discharge Instructions      -You are negative for flu and COVID.  Your x-ray does not show any evidence of pneumonia.  You likely have viral bronchitis.  I have sent prednisone to the pharmacy to help with shortness of breath and wheezing as well as an inhaler and a cough medication.  Increase rest and fluids.  Return if you develop a fever or worsening symptoms or if you are not feeling better after 2 weeks.     ED Prescriptions     Medication Sig Dispense Auth. Provider   promethazine-dextromethorphan (PROMETHAZINE-DM) 6.25-15 MG/5ML syrup Take 5 mLs by mouth 4 (four) times daily as needed for cough. 118 mL Laurene Footman B, PA-C   albuterol (VENTOLIN HFA) 108 (90 Base) MCG/ACT inhaler Inhale 1-2 puffs into the lungs every 6 (six) hours as needed for wheezing or shortness of breath. 1 g Laurene Footman B, PA-C   predniSONE (DELTASONE) 20 MG tablet Take 2 tablets (40 mg total) by mouth daily for 5 days. 10 tablet Gretta Cool      PDMP not reviewed this encounter.   Danton Clap, PA-C 01/16/22 1149

## 2022-05-10 IMAGING — MR MR KNEE*L* W/O CM
7 series · 40 of 40 positions shown · non-contrast
Comparison: MRI left knee dated July 17, 2019.

CLINICAL DATA: Chronic, progressively worsening anterior knee pain
since injury a few years ago. No prior surgery.

EXAM:
MRI OF THE LEFT KNEE WITHOUT CONTRAST
TECHNIQUE: Multiplanar, multisequence MR imaging of the knee was performed. No
intravenous contrast was administered.

[Series 8: T2 fat-sat · axial · left · 4.0mm · 0.50mm/px · z∈[-54,+69]mm · 6 of 26 slices shown (1 of 3)]
[im 1/26]
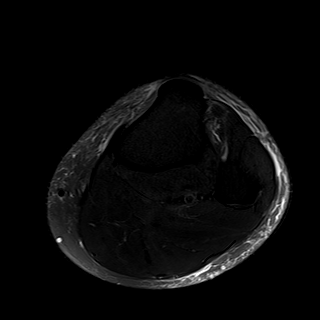
[im 6/26]
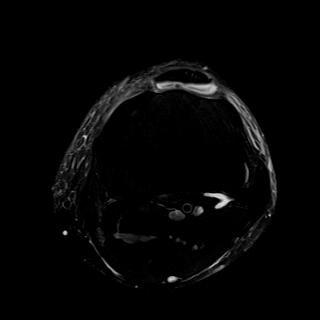
[im 11/26]
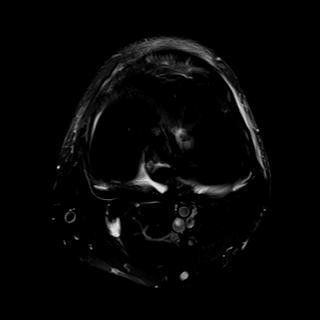
[im 16/26]
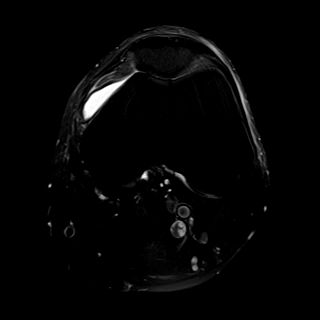
[im 21/26]
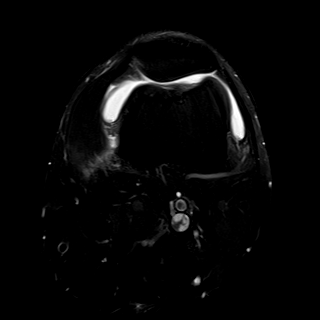
[im 26/26]
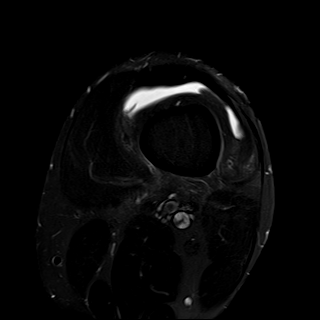

[Series 9: T2 fat-sat · coronal · left · 4.0mm · 0.59mm/px · 6 of 31 slices shown (2 of 3)]
[im 1/31]
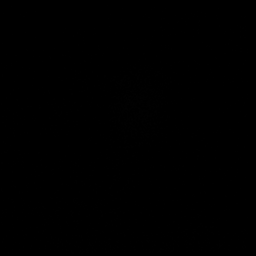
[im 7/31]
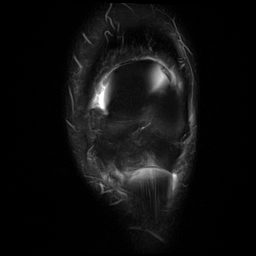
[im 13/31]
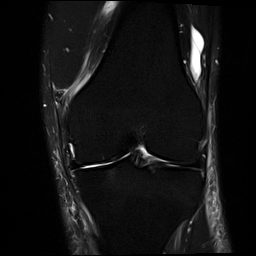
[im 19/31]
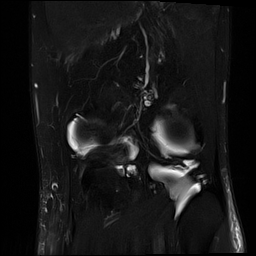
[im 25/31]
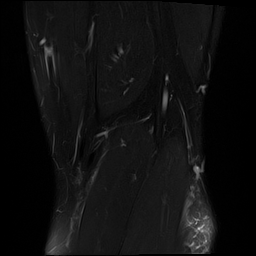
[im 31/31]
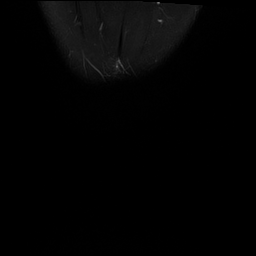

[Series 10: T1 · coronal · left · 4.0mm · 0.39mm/px · 6 of 32 slices shown]
[im 1/32]
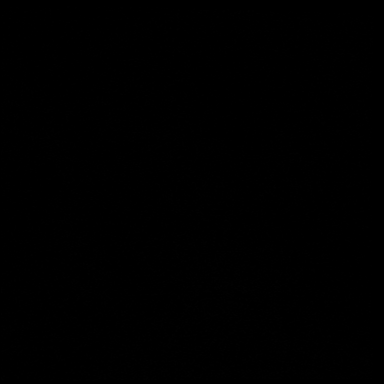
[im 7/32]
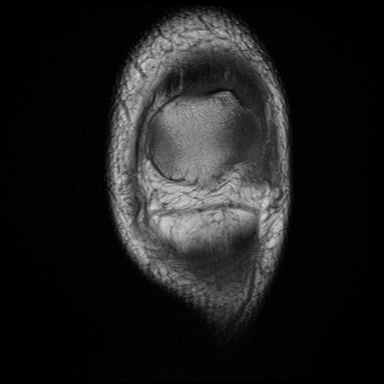
[im 13/32]
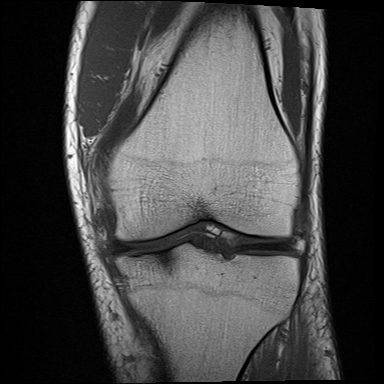
[im 19/32]
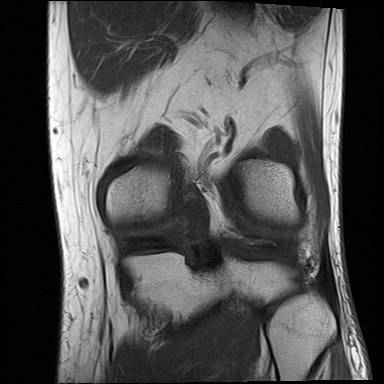
[im 25/32]
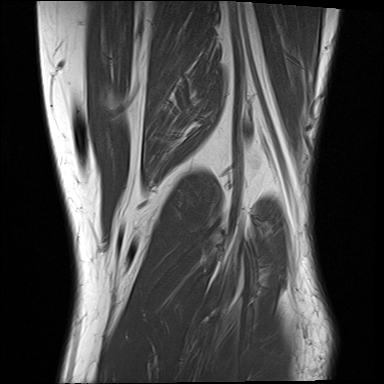
[im 32/32]
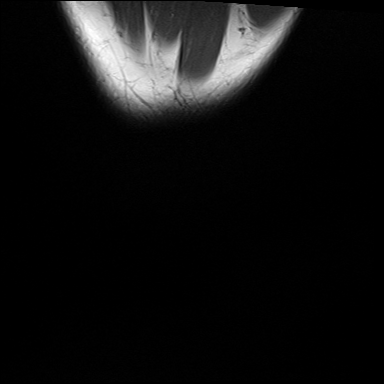

[Series 11: PD fat-sat · sagittal · left · 3.0mm · 0.59mm/px · 7 of 36 slices shown (1 of 2)]
[im 1/36]
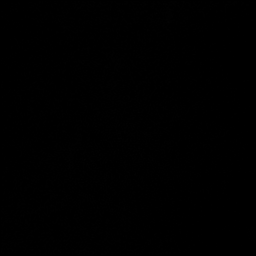
[im 6/36]
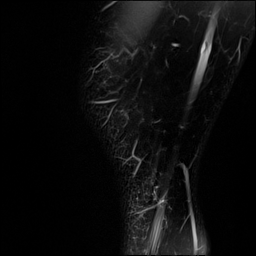
[im 12/36]
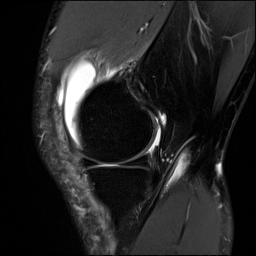
[im 18/36]
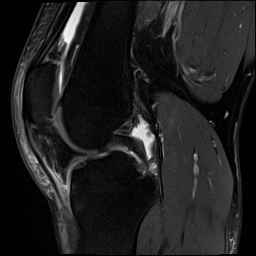
[im 24/36]
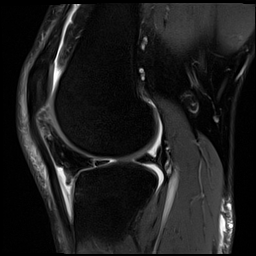
[im 30/36]
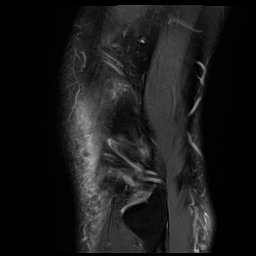
[im 36/36]
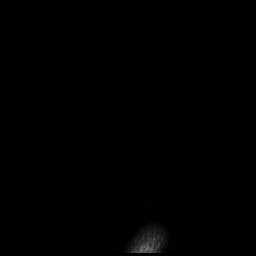

[Series 12: PD fat-sat · coronal · left · 4.0mm · 0.59mm/px · 6 of 31 slices shown (2 of 2)]
[im 1/31]
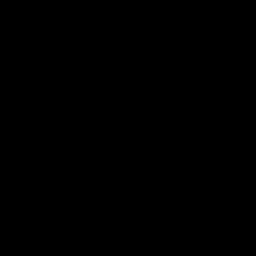
[im 7/31]
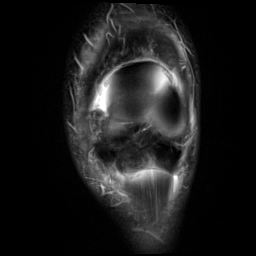
[im 13/31]
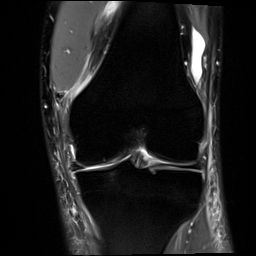
[im 19/31]
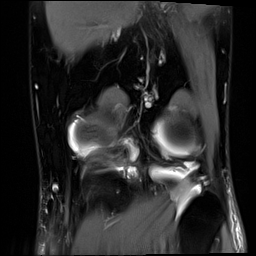
[im 25/31]
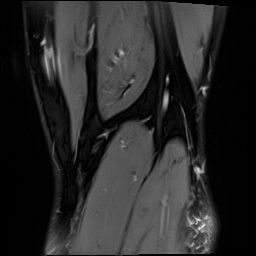
[im 31/31]
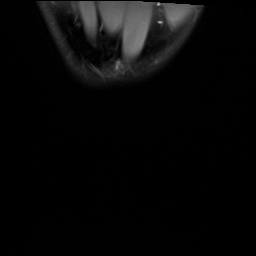

[Series 13: T2 fat-sat · sagittal · left · 3.0mm · 0.59mm/px · 7 of 36 slices shown (3 of 3)]
[im 1/36]
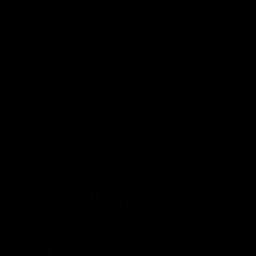
[im 6/36]
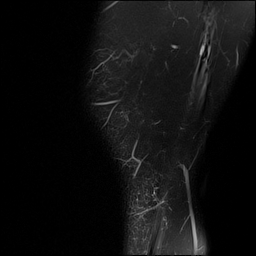
[im 12/36]
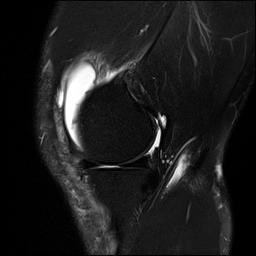
[im 18/36]
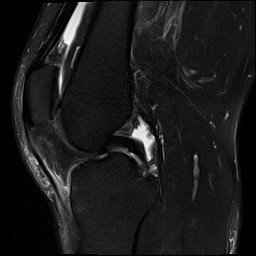
[im 24/36]
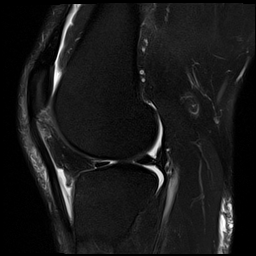
[im 30/36]
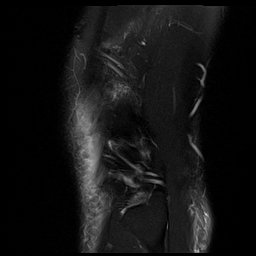
[im 36/36]
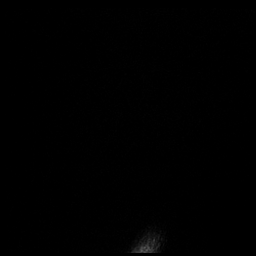

[Series 14: PD · coronal · left · 2.0mm · 0.47mm/px · 2 of 10 slices shown]
[im 1/10]
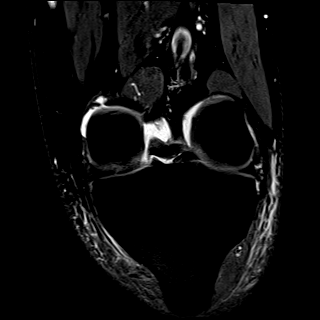
[im 10/10]
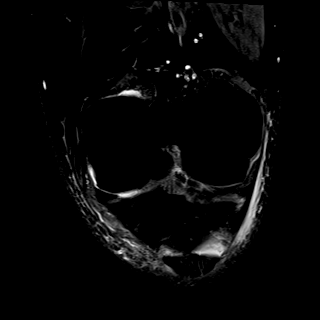

[40 of 40 positions shown; findings below may reference images not displayed]

FINDINGS: MENISCI

Medial meniscus:  Unchanged small radial tear of the posterior horn.

Lateral meniscus:  Intact.

LIGAMENTS

Cruciates:  Intact ACL and PCL.

Collaterals: Medial collateral ligament is intact. Lateral
collateral ligament complex is intact.

CARTILAGE

Patellofemoral:  No chondral defect.

Medial: Unchanged diffuse cartilage thinning and surface tear
urinary, most prominent over the central weight-bearing medial
femoral condyle. Persistent degenerative subchondral marrow edema in
the anterior medial tibial plateau.

Lateral:  No chondral defect.

Joint: Increased small joint effusion. Mild edema in the
superolateral aspect of Hoffa's fat. Thickened medial plica.

Popliteal Fossa: Unchanged tiny Baker cyst. Intact popliteus tendon.

Extensor Mechanism: Intact quadriceps tendon. Progressive thickening
and increased intermediate signal involving the entire patellar
tendon with unchanged tiny intrasubstance tear of the proximal
tendon. Intact medial and lateral patellar retinaculum. Intact MPFL.

Bones: No focal marrow signal abnormality. No fracture or
dislocation.

Other: New prominent fluid in the deep infrapatellar bursa.
Increasing anterior knee soft tissue swelling overlying the patella
and patellar tendon.
IMPRESSION: 1. Progressive moderate patellar tendinosis with unchanged tiny
intrasubstance tear of the proximal tendon. New deep infrapatellar
bursitis.
2. Unchanged small radial tear of the medial meniscus posterior
horn.
3. Increased small joint effusion.
4. Mild edema in the superolateral aspect of Hoffa's fat pad, which
can be seen in the setting of patellar tendon-lateral femoral
condyle friction syndrome.

## 2022-08-11 HISTORY — PX: MENISCUS REPAIR: SHX5179

## 2022-10-25 ENCOUNTER — Ambulatory Visit: Payer: Managed Care, Other (non HMO) | Admitting: Urology

## 2022-10-26 ENCOUNTER — Encounter: Payer: Self-pay | Admitting: Urology

## 2022-11-22 ENCOUNTER — Encounter: Payer: Self-pay | Admitting: *Deleted

## 2022-11-22 ENCOUNTER — Ambulatory Visit
Admission: EM | Admit: 2022-11-22 | Discharge: 2022-11-22 | Disposition: A | Discharge status: Home / Self Care | Payer: Managed Care, Other (non HMO)

## 2022-11-22 DIAGNOSIS — T24231A Burn of second degree of right lower leg, initial encounter: Secondary | ICD-10-CM | POA: Diagnosis not present

## 2022-11-22 MED ORDER — CEPHALEXIN 500 MG PO CAPS: 500.0000 mg | ORAL_CAPSULE | Freq: Two times a day (BID) | ORAL | 0 refills | Status: AC
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
## 2022-11-26 DIAGNOSIS — T24201A Burn of second degree of unspecified site of right lower limb, except ankle and foot, initial encounter: Secondary | ICD-10-CM | POA: Insufficient documentation

## 2022-11-26 DIAGNOSIS — L0390 Cellulitis, unspecified: Secondary | ICD-10-CM | POA: Insufficient documentation

## 2022-11-26 DIAGNOSIS — G8911 Acute pain due to trauma: Secondary | ICD-10-CM | POA: Insufficient documentation

## 2023-04-02 ENCOUNTER — Ambulatory Visit: Payer: Managed Care, Other (non HMO) | Admitting: Family Medicine

## 2023-04-05 ENCOUNTER — Ambulatory Visit: Payer: Managed Care, Other (non HMO) | Admitting: Family Medicine

## 2023-12-18 ENCOUNTER — Ambulatory Visit: Admitting: Nurse Practitioner

## 2023-12-18 ENCOUNTER — Ambulatory Visit
Admission: RE | Admit: 2023-12-18 | Discharge: 2023-12-18 | Disposition: A | Discharge status: Home / Self Care | Attending: Nurse Practitioner | Admitting: Nurse Practitioner

## 2023-12-18 ENCOUNTER — Ambulatory Visit: Payer: Self-pay | Admitting: Nurse Practitioner

## 2023-12-18 ENCOUNTER — Encounter: Payer: Self-pay | Admitting: Nurse Practitioner

## 2023-12-18 ENCOUNTER — Ambulatory Visit
Admission: RE | Admit: 2023-12-18 | Discharge: 2023-12-18 | Disposition: A | Source: Ambulatory Visit | Attending: Nurse Practitioner

## 2023-12-18 VITALS — BP 132/78 | HR 77 | Resp 16 | Ht 69.0 in | Wt 220.0 lb

## 2023-12-18 DIAGNOSIS — R748 Abnormal levels of other serum enzymes: Secondary | ICD-10-CM

## 2023-12-18 DIAGNOSIS — Z1159 Encounter for screening for other viral diseases: Secondary | ICD-10-CM

## 2023-12-18 DIAGNOSIS — M1A0710 Idiopathic chronic gout, right ankle and foot, without tophus (tophi): Secondary | ICD-10-CM | POA: Diagnosis not present

## 2023-12-18 DIAGNOSIS — F902 Attention-deficit hyperactivity disorder, combined type: Secondary | ICD-10-CM

## 2023-12-18 DIAGNOSIS — M7989 Other specified soft tissue disorders: Secondary | ICD-10-CM

## 2023-12-18 DIAGNOSIS — E781 Pure hyperglyceridemia: Secondary | ICD-10-CM

## 2023-12-18 DIAGNOSIS — Z6832 Body mass index (BMI) 32.0-32.9, adult: Secondary | ICD-10-CM

## 2023-12-18 DIAGNOSIS — Z131 Encounter for screening for diabetes mellitus: Secondary | ICD-10-CM

## 2023-12-18 DIAGNOSIS — K219 Gastro-esophageal reflux disease without esophagitis: Secondary | ICD-10-CM

## 2023-12-18 DIAGNOSIS — E1165 Type 2 diabetes mellitus with hyperglycemia: Secondary | ICD-10-CM

## 2023-12-18 DIAGNOSIS — Z130 Encounter for screening for diseases of the blood and blood-forming organs and certain disorders involving the immune mechanism: Secondary | ICD-10-CM

## 2023-12-18 DIAGNOSIS — E66811 Obesity, class 1: Secondary | ICD-10-CM

## 2023-12-18 DIAGNOSIS — E785 Hyperlipidemia, unspecified: Secondary | ICD-10-CM

## 2023-12-18 DIAGNOSIS — R079 Chest pain, unspecified: Secondary | ICD-10-CM

## 2023-12-18 DIAGNOSIS — K148 Other diseases of tongue: Secondary | ICD-10-CM

## 2023-12-18 DIAGNOSIS — Z720 Tobacco use: Secondary | ICD-10-CM

## 2023-12-18 DIAGNOSIS — Z8249 Family history of ischemic heart disease and other diseases of the circulatory system: Secondary | ICD-10-CM

## 2023-12-18 DIAGNOSIS — Z1211 Encounter for screening for malignant neoplasm of colon: Secondary | ICD-10-CM

## 2023-12-18 DIAGNOSIS — Z13220 Encounter for screening for lipoid disorders: Secondary | ICD-10-CM

## 2023-12-18 MED ORDER — AMPHETAMINE-DEXTROAMPHETAMINE 15 MG PO TABS: 1.0000 | ORAL_TABLET | Freq: Two times a day (BID) | ORAL | 0 refills | Status: DC

## 2023-12-18 NOTE — Progress Notes (Signed)
 BP 132/78   Pulse 77   Resp 16   Ht 5' 9 (1.753 m)   Wt 220 lb (99.8 kg)   SpO2 96%   BMI 32.49 kg/m    Subjective:    Patient ID: Ethan George, male    DOB: February 03, 1976, 48 y.o.   MRN: [id number]  HPI: Ethan George is a 48 y.o. male  Chief Complaint  Patient presents with   Establish Care   Hand Injury    L hand, swollen x1 month. Causing pain, went to ER and told is was infected bug bite   Discussed the use of AI scribe software for clinical note transcription with the patient, who gave verbal consent to proceed.  History of Present Illness Ethan George is a 48 year old male who presents to establish care after a recent emergency department visit for cellulitis, pneumonia, and chest pain. He is accompanied by his wife, Harlene.  Left hand swelling and cellulitis - Recent emergency department visit on November 25, 2023 for left hand swelling up to the wrist, diagnosed as cellulitis. - History of injury to the left hand with metal shards, which were not removed. - No prior episodes of gout in the hand. - Treated with Keflex  and doxycycline . - Use of Voltaren  and aspirin  cream with lidocaine  provided some relief and improved range of motion.  Chest pain and pulmonary symptoms - Acute onset of severe left-sided chest pain described as 'an elephant sitting on my chest,' prompting emergency evaluation. - Chest x-ray and EKG were normal. - Diagnosed with left lower lobe pneumonia during emergency visit. - No recurrent chest pain since the emergency visit, though mild discomfort persisted for several days after. - Significant family history of cardiac disease on paternal side.  Oral lesion - Persistent lesion under the tongue for approximately six months. - Lesion has been increasing in size and is occasionally painful, especially upon waking.  Gout - History of gout, previously affecting the toe. - No recent flares and not currently on urate-lowering therapy. -  Previously treated with allopurinol  but prefers to avoid medication unless necessary.  Gastroesophageal reflux symptoms - History of gastroesophageal reflux disease (GERD). - Symptoms managed with dietary modifications and meal timing. - Occasional episodes of acid reflux, particularly when eating late and lying down soon after.  Tobacco and alcohol use - Smokes approximately two packs of cigarettes per day. - History of smokeless tobacco use, discontinued due to gum issues. - Occasional alcohol consumption. - High-stress occupation contributing to ongoing tobacco use.  Metabolic abnormalities - Recent laboratory evaluation revealed elevated liver enzymes and glucose level of 193 mg/dL. - Previously informed of borderline diabetes diagnosis by another provider. - Not currently on diabetes medication.  Obesity - Weight is 220 pounds with a BMI of 32.49.         12/18/2023    2:06 PM 07/24/2021    2:56 PM 07/14/2021    1:52 PM  Depression screen PHQ 2/9  Decreased Interest 0 1 0  Down, Depressed, Hopeless 0 0 0  PHQ - 2 Score 0 1 0  Altered sleeping  3 3  Tired, decreased energy  1 1  Change in appetite  0 0  Feeling bad or failure about yourself   0 0  Trouble concentrating  2 2  Moving slowly or fidgety/restless  1 0  Suicidal thoughts  0 0  PHQ-9 Score  8 6  Difficult doing work/chores  Somewhat difficult Very difficult  Relevant past medical, surgical, family and social history reviewed and updated as indicated. Interim medical history since our last visit reviewed. Allergies and medications reviewed and updated.  Review of Systems  Ten systems reviewed and is negative except as mentioned in HPI      Objective:      BP 132/78   Pulse 77   Resp 16   Ht 5' 9 (1.753 m)   Wt 220 lb (99.8 kg)   SpO2 96%   BMI 32.49 kg/m    Wt Readings from Last 3 Encounters:  12/18/23 220 lb (99.8 kg)  11/22/22 210 lb (95.3 kg)  07/24/21 200 lb (90.7 kg)    Physical  Exam VITALS: BP- 132/78 MEASUREMENTS: Weight- 220, BMI- 32.49. GENERAL: Alert, cooperative, well developed, no acute distress. HEENT: Normocephalic, normal oropharynx, moist mucous membranes, lesion on the underside of the tongue. CHEST: Clear to auscultation bilaterally, no wheezes, rhonchi, or crackles. CARDIOVASCULAR: Normal heart rate and rhythm, S1 and S2 normal without murmurs. ABDOMEN: Soft, non-tender, non-distended, without organomegaly, normal bowel sounds. EXTREMITIES: No cyanosis or edema. NEUROLOGICAL: Cranial nerves grossly intact, moves all extremities without gross motor or sensory deficit.  Results for orders placed or performed during the hospital encounter of 01/16/22  Resp Panel by RT-PCR (Flu A&B, Covid) Anterior Nasal Swab   Collection Time: 01/16/22 10:56 AM   Specimen: Anterior Nasal Swab  Result Value Ref Range   SARS Coronavirus 2 by RT PCR NEGATIVE NEGATIVE   Influenza A by PCR NEGATIVE NEGATIVE   Influenza B by PCR NEGATIVE NEGATIVE          Assessment & Plan:   Problem List Items Addressed This Visit       Digestive   GERD (gastroesophageal reflux disease) - Primary     Other   Current tobacco use   Gout   Relevant Orders   Uric acid   Cellulitis   Class 1 obesity due to excess calories with serious comorbidity and body mass index (BMI) of 32.0 to 32.9 in adult   Relevant Medications   amphetamine-dextroamphetamine (ADDERALL) 15 MG tablet   amphetamine-dextroamphetamine (ADDERALL) 15 MG tablet (Start on 01/17/2024)   amphetamine-dextroamphetamine (ADDERALL) 15 MG tablet (Start on 02/16/2024)   Attention deficit hyperactivity disorder (ADHD), combined type   Relevant Medications   amphetamine-dextroamphetamine (ADDERALL) 15 MG tablet   amphetamine-dextroamphetamine (ADDERALL) 15 MG tablet (Start on 01/17/2024)   amphetamine-dextroamphetamine (ADDERALL) 15 MG tablet (Start on 02/16/2024)   Other Visit Diagnoses       Hyperlipidemia,  unspecified hyperlipidemia type       Relevant Orders   Lipid panel     Screening for deficiency anemia       Relevant Orders   CBC with Differential/Platelet     Screening for cholesterol level       Relevant Orders   Lipid panel     Screening for diabetes mellitus       Relevant Orders   Comprehensive metabolic panel with GFR   Hemoglobin A1c     Encounter for hepatitis C screening test for low risk patient       Relevant Orders   Hepatitis C antibody     Elevated liver enzymes         Family history of cardiac disorder       Relevant Orders   Ambulatory referral to Cardiology     Chest pain, unspecified type       Relevant Orders   Ambulatory referral to Cardiology  Tongue lesion       Relevant Orders   RPR   Herpes simplex virus culture   Ambulatory referral to Oral Maxillofacial Surgery     Screening for colon cancer       Relevant Orders   Cologuard        Assessment and Plan Assessment & Plan Establishing Care Establishing care visit to address multiple health concerns including recent emergency department visit for cellulitis and pneumonia, as well as ongoing management of chronic conditions. - Review and manage chronic conditions including gout, prediabetes, hypercholesterolemia, and GERD.  Left hand swelling and pain, status post cellulitis and foreign body Persistent swelling and pain in the left hand following cellulitis and possible foreign body. Swelling has persisted for four weeks. No history of gout in the hand. Reports previous injury with metal shards, possible need for surgery to remove foreign body. - Order x-ray of left hand. - Refer to orthopedic specialist at [facility]. - Consider walk-in visit to [facility] for urgent evaluation.  Suspected oral lesion of tongue, rule out malignancy Six-month history of a persistent lesion on the underside of the tongue, increasing in size. Differential includes malignancy, HSV, or syphilis. Concerned  due to family anecdote about tongue cancer. - Order swab for HSV. - Order blood test for syphilis. - Refer to oral surgeon/ENT for biopsy of tongue lesion.  Chest pain, evaluation for cardiac etiology Intermittent chest pain with a family history of heart disease. Previous emergency department evaluation included chest x-ray and EKG, which were normal. Referral to cardiology was placed but not yet followed up. No further chest pain since the ER visit. - Place referral to cardiology for further evaluation.  Obesity BMI of 32.49 indicating obesity. - Encourage continuation of lifestyle modifications, including dietary management and regular exercise. -continue to increase physical activity, getting at least 150 min of physical activity a week.  Work on including [employer], broadcasting/film/video 2 days a week.  - continue eating at a calorie deficit,eating a well balanced diet with whole foods, avoiding processed foods.   Patient is motivated to continue working on lifestyle modification.    Prediabetes Prediabetes with a recent glucose level of 193 mg/dL. - Order A1c test to evaluate glucose control.  Hypercholesterolemia Hypercholesterolemia. - Order lipid panel.  Elevated liver enzymes, under evaluation Elevated liver enzymes noted during recent emergency department visit. Possible dietary or alcohol-related etiology. - Recheck liver enzymes with CMP.  Gastroesophageal reflux disease (GERD) GERD managed with dietary modifications. Symptoms occur with late meals and lying down soon after eating.  ADHD, stable, on medication ADHD previously managed with Adderall. Currently not on medication due to difficulty accessing previous provider. - Restart Adderall 15 mg twice daily. - Schedule follow-up every three months for ADHD management.  Tobacco use disorder, cigarettes Current smoker with a history of using smokeless tobacco. Smoking approximately two packs per day. Stress at work noted as a  contributing factor.  General Health Maintenance Overdue for colon cancer screening. - Order Cologuard for colon cancer screening.  Follow-Up - Schedule follow-up appointment in three months or sooner if lab results are abnormal. - Ensure MyChart access for him to review test results and care plan.        Follow up plan: Return in about 3 months (around 03/19/2024) for follow up.

## 2023-12-19 ENCOUNTER — Other Ambulatory Visit: Payer: Self-pay

## 2023-12-19 ENCOUNTER — Telehealth: Payer: Self-pay | Admitting: Nurse Practitioner

## 2023-12-19 ENCOUNTER — Telehealth: Payer: Self-pay

## 2023-12-19 DIAGNOSIS — K148 Other diseases of tongue: Secondary | ICD-10-CM

## 2023-12-19 DIAGNOSIS — E1165 Type 2 diabetes mellitus with hyperglycemia: Secondary | ICD-10-CM | POA: Insufficient documentation

## 2023-12-19 DIAGNOSIS — E781 Pure hyperglyceridemia: Secondary | ICD-10-CM | POA: Insufficient documentation

## 2023-12-19 LAB — RPR: RPR Ser Ql: NONREACTIVE

## 2023-12-19 LAB — CBC WITH DIFFERENTIAL/PLATELET
Absolute Lymphocytes: 1681 {cells}/uL (ref 850–3900)
Absolute Monocytes: 431 {cells}/uL (ref 200–950)
Basophils Absolute: 44 {cells}/uL (ref 0–200)
Basophils Relative: 0.5 %
Eosinophils Absolute: 378 {cells}/uL (ref 15–500)
Eosinophils Relative: 4.3 %
HCT: 41.2 % (ref 38.5–50.0)
Hemoglobin: 14.3 g/dL (ref 13.2–17.1)
MCH: 32.8 pg (ref 27.0–33.0)
MCHC: 34.7 g/dL (ref 32.0–36.0)
MCV: 94.5 fL (ref 80.0–100.0)
MPV: 11 fL (ref 7.5–12.5)
Monocytes Relative: 4.9 %
Neutro Abs: 6266 {cells}/uL (ref 1500–7800)
Neutrophils Relative %: 71.2 %
Platelets: 286 Thousand/uL (ref 140–400)
RBC: 4.36 Million/uL (ref 4.20–5.80)
RDW: 12.6 % (ref 11.0–15.0)
Total Lymphocyte: 19.1 %
WBC: 8.8 Thousand/uL (ref 3.8–10.8)

## 2023-12-19 LAB — COMPREHENSIVE METABOLIC PANEL WITH GFR
AG Ratio: 1.8 (calc) (ref 1.0–2.5)
ALT: 41 U/L (ref 9–46)
AST: 30 U/L (ref 10–40)
Albumin: 4.8 g/dL (ref 3.6–5.1)
Alkaline phosphatase (APISO): 92 U/L (ref 36–130)
BUN: 17 mg/dL (ref 7–25)
CO2: 27 mmol/L (ref 20–32)
Calcium: 10 mg/dL (ref 8.6–10.3)
Chloride: 99 mmol/L (ref 98–110)
Creat: 1.23 mg/dL (ref 0.60–1.29)
Globulin: 2.7 g/dL (ref 1.9–3.7)
Glucose, Bld: 299 mg/dL — ABNORMAL HIGH (ref 65–139)
Potassium: 4.6 mmol/L (ref 3.5–5.3)
Sodium: 137 mmol/L (ref 135–146)
Total Bilirubin: 0.4 mg/dL (ref 0.2–1.2)
Total Protein: 7.5 g/dL (ref 6.1–8.1)
eGFR: 73 mL/min/1.73m2 (ref >=60)

## 2023-12-19 LAB — LIPID PANEL
Cholesterol: 237 mg/dL — ABNORMAL HIGH (ref <200)
HDL: 27 mg/dL — ABNORMAL LOW (ref >=40)
Non-HDL Cholesterol (Calc): 210 mg/dL — ABNORMAL HIGH (ref <130)
Total CHOL/HDL Ratio: 8.8 (calc) — ABNORMAL HIGH (ref <5.0)
Triglycerides: 1188 mg/dL — ABNORMAL HIGH (ref <150)

## 2023-12-19 LAB — HEMOGLOBIN A1C
Hgb A1c MFr Bld: 6.9 % — ABNORMAL HIGH (ref <5.7)
Mean Plasma Glucose: 151 mg/dL
eAG (mmol/L): 8.4 mmol/L

## 2023-12-19 LAB — HEPATITIS C ANTIBODY: Hepatitis C Ab: NONREACTIVE

## 2023-12-19 LAB — URIC ACID: Uric Acid, Serum: 10.3 mg/dL — ABNORMAL HIGH (ref 4.0–8.0)

## 2023-12-19 MED ORDER — ROSUVASTATIN CALCIUM 10 MG PO TABS: 10.0000 mg | ORAL_TABLET | Freq: Every day | ORAL | 1 refills | Status: AC

## 2023-12-19 MED ORDER — METFORMIN HCL 500 MG PO TABS: 500.0000 mg | ORAL_TABLET | Freq: Two times a day (BID) | ORAL | 1 refills | Status: AC

## 2023-12-19 NOTE — Telephone Encounter (Signed)
 Called new referral placed

## 2023-12-19 NOTE — Telephone Encounter (Signed)
 Copied from CRM #[id number]. Topic: Referral - Question >> Dec 19, 2023 11:19 AM Antwanette L wrote: Reason for CRM: Adriene Padula, the patient's wife, called to request information regarding the referral for a tongue lesion. She would like to know which office the referral was sent to and is requesting a callback at  [phone]

## 2023-12-19 NOTE — Telephone Encounter (Signed)
 Copied from CRM [id number]. Topic: Referral - Request for Referral >> Dec 19, 2023 10:44 AM Wyona SQUIBB wrote: Did the patient discuss referral with their provider in the last year? Yes (If No - schedule appointment) (If Yes - send message)  Appointment offered? No  Type of order/referral and detailed reason for visit: schedule a consult for lieson that possibly could be cancerous -pt has visit Monday and referral is needed before  Preference of office, provider, location:   [facility]  Callback [phone] Fax [phone]  If referral order, have you been seen by this specialty before? No (If Yes, this issue or another issue? When? Where?  Can we respond through MyChart? Yes

## 2023-12-19 NOTE — Telephone Encounter (Signed)
 Referral was placed yesterday can this be sent to the location preferred?

## 2023-12-20 LAB — HERPES SIMPLEX VIRUS CULTURE
MICRO NUMBER:: 17074351
SPECIMEN QUALITY:: ADEQUATE

## 2023-12-31 LAB — COLOGUARD: COLOGUARD: NEGATIVE

## 2024-01-26 ENCOUNTER — Encounter: Payer: Self-pay | Admitting: Nurse Practitioner

## 2024-01-28 ENCOUNTER — Other Ambulatory Visit: Payer: Self-pay | Admitting: Nurse Practitioner

## 2024-01-28 DIAGNOSIS — M1A0710 Idiopathic chronic gout, right ankle and foot, without tophus (tophi): Secondary | ICD-10-CM

## 2024-01-28 DIAGNOSIS — F902 Attention-deficit hyperactivity disorder, combined type: Secondary | ICD-10-CM

## 2024-01-28 MED ORDER — AMPHETAMINE-DEXTROAMPHETAMINE 20 MG PO TABS: 20.0000 mg | ORAL_TABLET | Freq: Two times a day (BID) | ORAL | 0 refills | Status: DC

## 2024-01-28 MED ORDER — PREDNISONE 10 MG (21) PO TBPK: ORAL_TABLET | ORAL | 0 refills | Status: DC

## 2024-01-28 NOTE — Progress Notes (Deleted)
  Cardiology Office Note   Date:  01/28/2024  ID:  Cardale, Dorer [birth year], MRN [id number] PCP: [name], FNP  [facility] Providers Cardiologist:  None { Click to update primary MD,subspecialty MD or APP then REFRESH:1}    History of Present Illness Ethan George is a 48 y.o. male PMH DM 2, HLD who presents for chest discomfort.  ***.  Last LDL unable to be calculated 12/2023.  Triglycerides >1000.  Relevant CVD History -None   ROS: Pt denies any chest discomfort, jaw pain, arm pain, palpitations, syncope, presyncope, orthopnea, PND, or LE edema.  Studies Reviewed I have independently reviewed the patient's ECG, previous medical records, previous blood work.  Physical Exam VS:  There were no vitals taken for this visit.       Wt Readings from Last 3 Encounters:  12/18/23 220 lb (99.8 kg)  11/22/22 210 lb (95.3 kg)  07/24/21 200 lb (90.7 kg)    GEN: No acute distress. NECK: No JVD; No carotid bruits. CARDIAC: ***RRR, no murmurs, rubs, gallops. RESPIRATORY:  Clear to auscultation. EXTREMITIES:  Warm and well-perfused. No edema.  ASSESSMENT AND PLAN Chest discomfort Severe hypertriglyceridemia HLD  Plan: -Recheck lipids and LFTs -Increase crestor to 40 -Add vascepa if TGs still significantly elevated      {Are you ordering a CV Procedure (e.g. stress test, cath, DCCV, TEE, etc)?   Press F2        :[id number]}  Dispo: ***  Signed, Caron Poser, MD

## 2024-01-30 ENCOUNTER — Ambulatory Visit

## 2024-02-18 ENCOUNTER — Encounter: Payer: Self-pay | Admitting: Family Medicine

## 2024-02-18 ENCOUNTER — Ambulatory Visit: Admitting: Family Medicine

## 2024-02-18 ENCOUNTER — Encounter: Payer: Self-pay | Admitting: Nurse Practitioner

## 2024-02-18 VITALS — BP 128/70 | HR 84 | Resp 16 | Ht 69.0 in | Wt 216.0 lb

## 2024-02-18 DIAGNOSIS — E1165 Type 2 diabetes mellitus with hyperglycemia: Secondary | ICD-10-CM

## 2024-02-18 DIAGNOSIS — Z7984 Long term (current) use of oral hypoglycemic drugs: Secondary | ICD-10-CM

## 2024-02-18 DIAGNOSIS — M1A09X0 Idiopathic chronic gout, multiple sites, without tophus (tophi): Secondary | ICD-10-CM

## 2024-02-18 DIAGNOSIS — M25522 Pain in left elbow: Secondary | ICD-10-CM

## 2024-02-18 MED ORDER — COLCHICINE 0.6 MG PO TABS: ORAL_TABLET | ORAL | 0 refills | Status: DC

## 2024-02-18 MED ORDER — ALLOPURINOL 100 MG PO TABS: 100.0000 mg | ORAL_TABLET | Freq: Every day | ORAL | 1 refills | Status: DC

## 2024-02-18 NOTE — Progress Notes (Signed)
 Acute Office Visit  Subjective:     Patient ID: CARZELL SALDIVAR, male    DOB: [birth year], 48 y.o.   MRN: [id number]  Chief Complaint  Patient presents with   Elbow Pain    Left. swollen, red, warm to touch. Has not hit it on anything. Feels like it may be gout.    HPI Patient is in today due to complaints of left elbow pain. He suspects elbow pain is s/t gout. He reports left elbow pain started yesterday evening and pain worsened through the night. He had a similar episode in the last one month affecting his left hand and was given a Prednisone  dose pack, and he states this resolved symptoms. He voices in the past gout also affected his right foot. He states he had modified his diet which reduced gout flares but recently he is still experiencing what he believes to be gout flares. He does admit that recently he did eat shrimp and feels that this could be the source of the left elbow pain, assuming that left elbow pain is s/t gout flare due to recent shrimp consumption. He also voices interest in starting maintenance medication, Allopurinol , to provide better control of gout management.  Review of Systems  Musculoskeletal:  Positive for joint pain.        Objective:    BP 128/70   Pulse 84   Resp 16   Ht 5' 9 (1.753 m)   Wt 216 lb (98 kg)   SpO2 97%   BMI 31.90 kg/m    Physical Exam Constitutional:      Appearance: Normal appearance. He is not toxic-appearing.  HENT:     Head: Normocephalic and atraumatic.  Cardiovascular:     Rate and Rhythm: Normal rate and regular rhythm.  Pulmonary:     Effort: Pulmonary effort is normal.     Breath sounds: Normal breath sounds.  Musculoskeletal:     Right elbow: Normal.     Left elbow: Swelling present. Decreased range of motion. Tenderness present.  Skin:    General: Skin is warm and dry.     Findings: Erythema present.  Neurological:     General: No focal deficit present.     Mental Status: He is alert.  Psychiatric:         Mood and Affect: Mood normal.        Behavior: Behavior normal.       Assessment & Plan:   1. Elbow pain, left (Primary) Patient complains of left elbow pain starting yesterday. Left elbow erythematous, tender to palpation, mild swelling localized to the left elbow. He suspects that left elbow pain is s/t gout flare. He had been treated with Prednisone  in the past for acute gout flares and this was effective. Discussed due to type 2 DM diagnosis will try Colchicine  first. He is agreeable. He voices Colchicine  has been effective in the past.  Uric acid level obtained today; last uric acid elevated at 10.3 on 12/18/23.  - Uric acid - CBC with Differential/Platelet - colchicine  0.6 MG tablet; Take two tablets (1.2mg ) by mouth at time of symptom onset, then one hour later may take one tablet (0.6mg ). Max of 1.8mg  total dose in 24 hour period. Ok to take one tablet (0.6mg ) by mouth once to twice a day for up to 7 days.  Dispense: 20 tablet; Refill: 0  2. Idiopathic chronic gout of multiple sites without tophus He reports he has been diagnosed with gout and in years  past he has taken Allopurinol  for maintenance therapy. He is interested in restarting Allopurinol . He voices he has made dietary modifications which did seem to help control gout, however recently he has experienced more flares. Treated for left hand pain thought to be gout last month with steroid dose pack. He reports hx of gout affecting the right foot. Hx of gout affecting several sites.  -Restart Allopurinol  100mg  once daily. Advised that he does NOT start Allopurinol  until after acute flare resolves. He voices understanding. -Continue dietary modifications.  -Has scheduled f/u with PCP on 03/20/24. Advised to maintain scheduled appointment.  - Uric acid - CBC with Differential/Platelet - allopurinol  (ZYLOPRIM ) 100 MG tablet; Take 1 tablet (100 mg total) by mouth daily.  Dispense: 30 tablet; Refill: 1 - colchicine  0.6 MG tablet; Take  two tablets (1.2mg ) by mouth at time of symptom onset, then one hour later may take one tablet (0.6mg ). Max of 1.8mg  total dose in 24 hour period. Ok to take one tablet (0.6mg ) by mouth once to twice a day for up to 7 days.  Dispense: 20 tablet; Refill: 0  3. Type 2 diabetes mellitus with hyperglycemia, without long-term current use of insulin (HCC) Type 2 Dm managed with Metformin .  Last A1c 6.9 on 12/18/23. Refrain from steroid use unless Colchicine  is not effective. Patient voices understanding and is agreeable. Return precautions advised.   Meds ordered this encounter  Medications   allopurinol  (ZYLOPRIM ) 100 MG tablet    Sig: Take 1 tablet (100 mg total) by mouth daily.    Dispense:  30 tablet    Refill:  1   colchicine  0.6 MG tablet    Sig: Take two tablets (1.2mg ) by mouth at time of symptom onset, then one hour later may take one tablet (0.6mg ). Max of 1.8mg  total dose in 24 hour period. Ok to take one tablet (0.6mg ) by mouth once to twice a day for up to 7 days.    Dispense:  20 tablet    Refill:  0    Return if symptoms worsen or fail to improve.  LAYMON LOISE CORE, FNP

## 2024-02-19 ENCOUNTER — Ambulatory Visit: Payer: Self-pay | Admitting: Family Medicine

## 2024-02-19 LAB — CBC WITH DIFFERENTIAL/PLATELET
Absolute Lymphocytes: 1633 {cells}/uL (ref 850–3900)
Absolute Monocytes: 666 {cells}/uL (ref 200–950)
Basophils Absolute: 52 {cells}/uL (ref 0–200)
Basophils Relative: 0.5 %
Eosinophils Absolute: 291 {cells}/uL (ref 15–500)
Eosinophils Relative: 2.8 %
HCT: 41.2 % (ref 39.4–51.1)
Hemoglobin: 13.8 g/dL (ref 13.2–17.1)
MCH: 31.2 pg (ref 27.0–33.0)
MCHC: 33.5 g/dL (ref 31.6–35.4)
MCV: 93.2 fL (ref 81.4–101.7)
MPV: 11.2 fL (ref 7.5–12.5)
Monocytes Relative: 6.4 %
Neutro Abs: 7758 {cells}/uL (ref 1500–7800)
Neutrophils Relative %: 74.6 %
Platelets: 299 Thousand/uL (ref 140–400)
RBC: 4.42 Million/uL (ref 4.20–5.80)
RDW: 12.8 % (ref 11.0–15.0)
Total Lymphocyte: 15.7 %
WBC: 10.4 Thousand/uL (ref 3.8–10.8)

## 2024-02-19 LAB — URIC ACID: Uric Acid, Serum: 9.5 mg/dL — ABNORMAL HIGH (ref 4.0–8.0)

## 2024-03-20 ENCOUNTER — Ambulatory Visit: Admitting: Nurse Practitioner

## 2024-03-20 VITALS — BP 146/96 | HR 81 | Temp 97.0°F | Ht 69.0 in | Wt 216.0 lb

## 2024-03-20 DIAGNOSIS — M1A09X0 Idiopathic chronic gout, multiple sites, without tophus (tophi): Secondary | ICD-10-CM

## 2024-03-20 DIAGNOSIS — K219 Gastro-esophageal reflux disease without esophagitis: Secondary | ICD-10-CM

## 2024-03-20 DIAGNOSIS — M25522 Pain in left elbow: Secondary | ICD-10-CM

## 2024-03-20 DIAGNOSIS — F902 Attention-deficit hyperactivity disorder, combined type: Secondary | ICD-10-CM

## 2024-03-20 DIAGNOSIS — Z6832 Body mass index (BMI) 32.0-32.9, adult: Secondary | ICD-10-CM

## 2024-03-20 DIAGNOSIS — E1165 Type 2 diabetes mellitus with hyperglycemia: Secondary | ICD-10-CM

## 2024-03-20 DIAGNOSIS — Z7984 Long term (current) use of oral hypoglycemic drugs: Secondary | ICD-10-CM

## 2024-03-20 DIAGNOSIS — E66811 Obesity, class 1: Secondary | ICD-10-CM

## 2024-03-20 LAB — POCT GLYCOSYLATED HEMOGLOBIN (HGB A1C): Hemoglobin A1C: 6.2 % — AB (ref 4.0–5.6)

## 2024-03-20 MED ORDER — AMPHETAMINE-DEXTROAMPHETAMINE 20 MG PO TABS: 20.0000 mg | ORAL_TABLET | Freq: Two times a day (BID) | ORAL | 0 refills | Status: AC

## 2024-03-20 MED ORDER — COLCHICINE 0.6 MG PO TABS: ORAL_TABLET | ORAL | 1 refills | Status: AC

## 2024-03-20 MED ORDER — LANCET DEVICE MISC: 1.0000 | Freq: Three times a day (TID) | 0 refills | Status: AC

## 2024-03-20 MED ORDER — ALLOPURINOL 200 MG PO TABS: 200.0000 mg | ORAL_TABLET | Freq: Every day | ORAL | 1 refills | Status: DC

## 2024-03-20 MED ORDER — LANCETS MISC: 1.0000 | 0 refills | Status: AC

## 2024-03-20 MED ORDER — BLOOD GLUCOSE MONITORING SUPPL DEVI: 1.0000 | Freq: Three times a day (TID) | 0 refills | Status: AC

## 2024-03-20 MED ORDER — BLOOD GLUCOSE TEST VI STRP: 1.0000 | ORAL_STRIP | Freq: Three times a day (TID) | 0 refills | Status: AC

## 2024-03-20 NOTE — Progress Notes (Signed)
 "  BP (!) 146/96   Pulse 81   Temp (!) 97 F (36.1 C)   Ht 5' 9 (1.753 m)   Wt 216 lb (98 kg)   SpO2 96%   BMI 31.90 kg/m    Subjective:    Patient ID: Ethan George, male    DOB: [birth year], 49 y.o.   MRN: [id number]  HPI: Ethan George is a 49 y.o. male  Chief Complaint  Patient presents with   Medical Management of Chronic Issues   Discussed the use of AI scribe software for clinical note transcription with the patient, who gave verbal consent to proceed.  History of Present Illness Ethan George is a 49 year old male who presents for a routine follow-up.  Gout and joint pain - Recent flares affecting elbow and knee with severe, unique pain - Currently out of colchicine  0.6 mg for acute flares - Currently out of allopurinol  100 mg daily - Recent uric acid level 9.5  Type 2 diabetes mellitus - Currently taking metformin  500 mg twice daily - Last hemoglobin A1c was 6.9% in October 2025 - Does not check blood glucose at home due to lack of glucometer - Intermittent blurry vision  Attention deficit hyperactivity disorder (adhd) - On Adderall 20 mg twice daily - Medication is currently out - Adderall provides effective symptom control when available  Hyperlipidemia - On rosuvastatin  10 mg daily - Cholesterol medication requires refill  Blood pressure monitoring - Does not check blood pressure at home - Unaware of current blood pressure status - Wife has a blood pressure monitor at home         12/18/2023    2:06 PM 07/24/2021    2:56 PM 07/14/2021    1:52 PM  Depression screen PHQ 2/9  Decreased Interest 0 1 0  Down, Depressed, Hopeless 0 0 0  PHQ - 2 Score 0 1 0  Altered sleeping  3 3  Tired, decreased energy  1 1  Change in appetite  0 0  Feeling bad or failure about yourself   0 0  Trouble concentrating  2 2  Moving slowly or fidgety/restless  1 0  Suicidal thoughts  0 0  PHQ-9 Score  8  6   Difficult doing work/chores  Somewhat difficult Very  difficult     Data saved with a previous flowsheet row definition    Relevant past medical, surgical, family and social history reviewed and updated as indicated. Interim medical history since our last visit reviewed. Allergies and medications reviewed and updated.  Review of Systems  Constitutional: Negative for fever or weight change.  Respiratory: Negative for cough and shortness of breath.   Cardiovascular: Negative for chest pain or palpitations.  Gastrointestinal: Negative for abdominal pain, no bowel changes.  Musculoskeletal: Negative for gait problem or joint swelling.  Skin: Negative for rash.  Neurological: Negative for dizziness or headache.  No other specific complaints in a complete review of systems (except as listed in HPI above).      Objective:      BP (!) 146/96   Pulse 81   Temp (!) 97 F (36.1 C)   Ht 5' 9 (1.753 m)   Wt 216 lb (98 kg)   SpO2 96%   BMI 31.90 kg/m    Wt Readings from Last 3 Encounters:  03/20/24 216 lb (98 kg)  02/18/24 216 lb (98 kg)  12/18/23 220 lb (99.8 kg)    Physical Exam GENERAL: Alert, cooperative,  well developed, no acute distress HEENT: Normocephalic, normal oropharynx, moist mucous membranes CHEST: Clear to auscultation bilaterally, No wheezes, rhonchi, or crackles CARDIOVASCULAR: Normal heart rate and rhythm, S1 and S2 normal without murmurs ABDOMEN: Soft, non-tender, non-distended, without organomegaly, Normal bowel sounds EXTREMITIES: No cyanosis or edema NEUROLOGICAL: Cranial nerves grossly intact, Moves all extremities without gross motor or sensory deficit  Results for orders placed or performed in visit on 03/20/24  POCT HgB A1C   Collection Time: 03/20/24  2:55 PM  Result Value Ref Range   Hemoglobin A1C 6.2 (A) 4.0 - 5.6 %   HbA1c POC (<> result, manual entry)     HbA1c, POC (prediabetic range)     HbA1c, POC (controlled diabetic range)            Assessment & Plan:   Problem List Items Addressed  This Visit       Digestive   GERD (gastroesophageal reflux disease) - Primary     Endocrine   Type 2 diabetes mellitus with hyperglycemia, without long-term current use of insulin (HCC)   Relevant Medications   Blood Glucose Monitoring Suppl DEVI   Glucose Blood (BLOOD GLUCOSE TEST STRIPS) STRP   Lancet Device MISC   Lancets MISC   Other Relevant Orders   Ambulatory referral to Ophthalmology   HM Diabetes Foot Exam (Completed)   Microalbumin / creatinine urine ratio   POCT HgB A1C (Completed)     Other   Gout   Relevant Medications   allopurinol  200 MG TABS   colchicine  0.6 MG tablet   Class 1 obesity due to excess calories with serious comorbidity and body mass index (BMI) of 32.0 to 32.9 in adult   Relevant Medications   amphetamine -dextroamphetamine  (ADDERALL) 20 MG tablet (Start on 05/19/2024)   amphetamine -dextroamphetamine  (ADDERALL) 20 MG tablet (Start on 04/19/2024)   amphetamine -dextroamphetamine  (ADDERALL) 20 MG tablet   Attention deficit hyperactivity disorder (ADHD), combined type   Relevant Medications   amphetamine -dextroamphetamine  (ADDERALL) 20 MG tablet (Start on 05/19/2024)   amphetamine -dextroamphetamine  (ADDERALL) 20 MG tablet (Start on 04/19/2024)   amphetamine -dextroamphetamine  (ADDERALL) 20 MG tablet   Other Visit Diagnoses       Elbow pain, left       Relevant Medications   colchicine  0.6 MG tablet        Assessment and Plan Assessment & Plan Idiopathic chronic gout Gout flare with pain initially in the elbow, then knee, and previously behind the big toe. Uric acid level was 9.5, indicating high levels. Currently out of colchicine  and allopurinol . - Increased allopurinol  to 200 mg daily. - Sent colchicine  with a refill for acute flare-ups.  Type 2 diabetes mellitus A1c improved from 10.0 to 6.2, indicating good control. Reports intermittent blurry vision, possibly related to diabetes. Not currently checking blood sugar at home. - Provided  glucometer for home blood sugar monitoring. - Instructed to check blood sugar once or twice a week, preferably fasting, with a goal of less than 150 mg/dL. - Ordered microalbumin urine test to monitor kidney function. - Referred to eye doctor for vision evaluation.  Essential hypertension Blood pressure was elevated during the visit, possibly due to excitement. Does not currently monitor blood pressure at home. - Instructed to check blood pressure at home a couple of times before the next visit. - Advised to monitor blood pressure in a calm state and report if consistently high. - Discussed potential need for blood pressure medication if home readings remain high.  Attention-deficit hyperactivity disorder, combined type Currently  taking Adderall 20 mg twice daily. Reports it works well but is out of medication. - Sent prescription for Adderall 20 mg twice daily.  Obesity, class 1 Discussed lifestyle modifications including diet and exercise. - Continue lifestyle modifications including eating in a calorie deficit and engaging in at least 150 minutes of physical activity per week with strength training.  Hyperlipidemia Currently taking rosuvastatin  10 mg daily. - Continue rosuvastatin  10 mg daily.  Gastroesophageal reflux disease stable  General Health Maintenance Discussed the importance of regular health screenings and lifestyle modifications. - Continue routine health maintenance and screenings.        Follow up plan: Return in about 3 months (around 06/18/2024) for follow up. "

## 2024-03-21 LAB — MICROALBUMIN / CREATININE URINE RATIO
Creatinine, Urine: 86 mg/dL (ref 20–320)
Microalb Creat Ratio: 3 mg/g{creat}
Microalb, Ur: 0.3 mg/dL

## 2024-03-23 ENCOUNTER — Telehealth: Payer: Self-pay | Admitting: Pharmacy Technician

## 2024-03-23 ENCOUNTER — Ambulatory Visit: Payer: Self-pay | Admitting: Nurse Practitioner

## 2024-03-23 ENCOUNTER — Other Ambulatory Visit (HOSPITAL_COMMUNITY): Payer: Self-pay

## 2024-03-23 NOTE — Telephone Encounter (Signed)
 Pharmacy Patient Advocate Encounter   Received notification from CoverMyMeds that prior authorization for Allopurinol  200MG  tablets is required/requested.   Insurance verification completed.   The patient is insured through [employer].   Per test claim:  ALLOPURINOL  100 MG TABS is preferred by the insurance.  If suggested medication is appropriate, Please send in a new RX and discontinue this one. If not, please advise as to why it's not appropriate so that we may request a Prior Authorization. Please note, some preferred medications may still require a PA.  If the suggested medications have not been trialed and there are no contraindications to their use, the PA will not be submitted, as it will not be approved.   Patient prescription will need to be changed to 100 mg- 2 tablets daily if appropriate

## 2024-03-24 ENCOUNTER — Other Ambulatory Visit: Payer: Self-pay | Admitting: Nurse Practitioner

## 2024-03-24 DIAGNOSIS — M1A09X0 Idiopathic chronic gout, multiple sites, without tophus (tophi): Secondary | ICD-10-CM

## 2024-03-24 MED ORDER — ALLOPURINOL 100 MG PO TABS: 100.0000 mg | ORAL_TABLET | Freq: Two times a day (BID) | ORAL | 1 refills | Status: AC

## 2024-03-24 NOTE — Telephone Encounter (Unsigned)
 Copied from CRM #[id number]. Topic: Clinical - Medical Advice >> Mar 24, 2024  8:31 AM Rea ORN wrote: Reason for CRM: Pt wife Harlene called to advise [employer] will not cover allopurinol  200 MG. They will cover allopurinol  100 MG. Harlene is asking for PCP to change the dose and instructions to 100 mg bid. Harlene stated the pt has been out of this rx for a few days and is asking if this can be done today.  Please call Harlene back to advise,  [phone]

## 2024-04-01 ENCOUNTER — Ambulatory Visit: Payer: Self-pay

## 2024-04-01 NOTE — Telephone Encounter (Signed)
" °  FYI Only or Action Required?: FYI only for provider: referred to UC.  Patient was last seen in primary care on 03/20/2024 by Ethan Mliss FALCON, FNP.  Called Nurse Triage reporting Chest Pain and Abdominal Pain.  Symptoms began 3 days ago.  Interventions attempted: OTC medications: topical creams.  Symptoms are: gradually worsening.  Triage Disposition: See HCP Within 4 Hours (Or PCP Triage)  Patient/caregiver understands and will follow disposition?: Yes    Reason for Triage: Sharp pains anytime he takes a deep breath, between hip and bottom of rib cage. Nothing is helping, has been coughing heavy for a while. Could be cracked rib. Looking to be seen today after 12 noon.  Ethan George is calling, not currently with the patient.   Reason for Disposition  Chest pain  (Exception: MILD central chest pain, present only when coughing.)  [1] MILD-MODERATE pain AND [2] constant AND [3] present > 2 hours  Answer Assessment - Initial Assessment Questions Wife requesting appt tomorrow for pt. She is not with him at this time. Declined appt in another office today unable to be seen until after 5pm. Advised to go to UC today   1. LOCATION: Where does it hurt?      Between rt rib and hip 2. RADIATION: Does the pain shoot anywhere else? (e.g., chest, back)     no 3. ONSET: When did the pain begin? (Minutes, hours or days ago)      sunday 4. SUDDEN: Gradual or sudden onset?     sudden 5. PATTERN Does the pain come and go, or is it constant?     constent 6. SEVERITY: How bad is the pain?  (e.g., Scale 1-10; mild, moderate, or severe)     moderate 7. RECURRENT SYMPTOM: Have you ever had this type of stomach pain before? If Yes, ask: When was the last time? and What happened that time?      no 8. CAUSE: What do you think is causing the stomach pain? (e.g., gallstones, recent abdominal surgery)     coughing 9. RELIEVING/AGGRAVATING FACTORS: What makes it better or  worse? (e.g., antacids, bending or twisting motion, bowel movement)      10 . OTHER SYMPTOMS: Do you have any other symptoms? (e.g., back pain, diarrhea, fever, urination pain, vomiting)  Protocols used: Cough - Acute Productive-A-AH, Abdominal Pain - Male-A-AH  "

## 2024-04-02 ENCOUNTER — Ambulatory Visit

## 2024-04-02 ENCOUNTER — Ambulatory Visit
Admission: EM | Admit: 2024-04-02 | Discharge: 2024-04-02 | Disposition: A | Discharge status: Home / Self Care | Attending: Emergency Medicine | Admitting: Emergency Medicine

## 2024-04-02 DIAGNOSIS — F17200 Nicotine dependence, unspecified, uncomplicated: Secondary | ICD-10-CM | POA: Diagnosis not present

## 2024-04-02 DIAGNOSIS — M62838 Other muscle spasm: Secondary | ICD-10-CM

## 2024-04-02 DIAGNOSIS — R0789 Other chest pain: Secondary | ICD-10-CM | POA: Diagnosis not present

## 2024-04-02 LAB — POCT URINE DIPSTICK
Bilirubin, UA: NEGATIVE
Blood, UA: NEGATIVE
Glucose, UA: NEGATIVE mg/dL
Ketones, POC UA: NEGATIVE mg/dL
Leukocytes, UA: NEGATIVE
Nitrite, UA: NEGATIVE
Protein Ur, POC: NEGATIVE mg/dL
Spec Grav, UA: 1.01
Urobilinogen, UA: 0.2 U/dL
pH, UA: 5.5

## 2024-04-02 MED ORDER — IBUPROFEN 600 MG PO TABS: 600.0000 mg | ORAL_TABLET | Freq: Three times a day (TID) | ORAL | 0 refills | Status: AC | PRN

## 2024-04-02 MED ORDER — CYCLOBENZAPRINE HCL 5 MG PO TABS: 5.0000 mg | ORAL_TABLET | Freq: Three times a day (TID) | ORAL | 0 refills | Status: AC | PRN

## 2024-04-02 NOTE — ED Triage Notes (Addendum)
 Patient to Urgent Care with complaints of right sided lower rib/ RUQ pain.   Symptoms started Saturday. Reports he fell asleep on the couch and the arm pushed into the right side of his ribs. Feels like the pain is worsening.  Taking ibuprofen .

## 2024-04-02 NOTE — Discharge Instructions (Addendum)
 Your urine is negative Your xray is normal Take ibuprofen  and flexeril  as prescribed. May use over the counter(lidocaine  patch or gel as prescribed) May use heat or ice to area 20 minutes 3 x daily Stop smoking.

## 2024-04-02 NOTE — ED Provider Notes (Signed)
 " [facility]    CSN: [id number] Arrival date & time: 04/02/24  1200      History   Chief Complaint Chief Complaint  Patient presents with   Rib Injury    HPI Ethan George is a 49 y.o. male.   Ethan George, 49 year old male pt, presents to urgent care for evaluation of right lateral rib pain x 5 days;  patient states he fell asleep on the couch and the arm of the couch pushed into the right side of his ribs, feels like the pain is worsening.  Patient has been taking ibuprofen  for discomfort.   Smoker  The history is provided by the patient and the spouse. No language interpreter was used.    Past Medical History:  Diagnosis Date   Chronic bronchitis (HCC)    GERD (gastroesophageal reflux disease)    Gout     Patient Active Problem List   Diagnosis Date Noted   Rib pain on right side 04/02/2024   Muscle spasm 04/02/2024   Pure hypertriglyceridemia 12/19/2023   Type 2 diabetes mellitus with hyperglycemia, without long-term current use of insulin (HCC) 12/19/2023   Class 1 obesity due to excess calories with serious comorbidity and body mass index (BMI) of 32.0 to 32.9 in adult 12/18/2023   Attention deficit hyperactivity disorder (ADHD), combined type 12/18/2023   Cellulitis 11/26/2022   Cervical spondylosis with radiculopathy 07/06/2021   Gout 06/02/2019   Sprain of foot 12/29/2018   GERD (gastroesophageal reflux disease) 12/31/2016   Bulge of lumbar disc without myelopathy 10/01/2014   Smoker 10/01/2014    Past Surgical History:  Procedure Laterality Date   BACK SURGERY  2012,2016   X 2   I & D EXTREMITY Left 10/27/2019   Procedure: Left open infrapatellar bursa irrigation and debridement;  Surgeon: Tobie Priest, MD;  Location: [facility];  Service: Orthopedics;  Laterality: Left;   KNEE ARTHROSCOPY Left 10/27/2019   Procedure: ARTHROSCOPY KNEE;  Surgeon: Tobie Priest, MD;  Location: [facility];  Service: Orthopedics;  Laterality: Left;   KNEE  ARTHROSCOPY WITH SUBCHONDROPLASTY Left 09/28/2019   Procedure: Left Knee open irrigation and debridement of the infrapatellar bursa.;  Surgeon: Tobie Priest, MD;  Location: [facility];  Service: Orthopedics;  Laterality: Left;   MENISCUS REPAIR Left 08/2022       Home Medications    Prior to Admission medications  Medication Sig Start Date End Date Taking? Authorizing Provider  cyclobenzaprine  (FLEXERIL ) 5 MG tablet Take 1 tablet (5 mg total) by mouth 3 (three) times daily as needed for up to 5 days for muscle spasms. 04/02/24 04/07/24 Yes Autumn Gunn, NP  ibuprofen  (ADVIL ) 600 MG tablet Take 1 tablet (600 mg total) by mouth every 8 (eight) hours as needed for up to 5 days for moderate pain (pain score 4-6). 04/02/24 04/07/24 Yes Oluwatoni Rotunno, NP  allopurinol  (ZYLOPRIM ) 100 MG tablet Take 1 tablet (100 mg total) by mouth 2 (two) times daily. 03/24/24   Pender, Julie F, FNP  amphetamine -dextroamphetamine  (ADDERALL) 20 MG tablet Take 1 tablet (20 mg total) by mouth 2 (two) times daily. 05/19/24   Pender, Julie F, FNP  amphetamine -dextroamphetamine  (ADDERALL) 20 MG tablet Take 1 tablet (20 mg total) by mouth 2 (two) times daily. 04/19/24   Pender, Julie F, FNP  amphetamine -dextroamphetamine  (ADDERALL) 20 MG tablet Take 1 tablet (20 mg total) by mouth 2 (two) times daily. 03/20/24   Pender, Julie F, FNP  Blood Glucose Monitoring Suppl DEVI 1 each  by Does not apply route in the morning, at noon, and at bedtime. May substitute to any manufacturer covered by patient's insurance. 03/20/24   Pender, Julie F, FNP  colchicine  0.6 MG tablet Take two tablets (1.2mg ) by mouth at time of symptom onset, then one hour later may take one tablet (0.6mg ). Max of 1.8mg  total dose in 24 hour period. Ok to take one tablet (0.6mg ) by mouth once to twice a day for up to 7 days. 03/20/24   Pender, Julie F, FNP  Glucose Blood (BLOOD GLUCOSE TEST STRIPS) STRP 1 each by In Vitro route in the morning, at noon, and at bedtime.  May substitute to any manufacturer covered by patient's insurance. 03/20/24 04/19/24  [name], FNP  Lancet Device MISC 1 each by Does not apply route in the morning, at noon, and at bedtime. May substitute to any manufacturer covered by patient's insurance. 03/20/24 04/19/24  [name], FNP  Lancets MISC 1 each by Does not apply route as directed. Dispense based on patient and insurance preference. Use up to four times daily as directed. (FOR ICD-10 E10.9, E11.9). 03/20/24   [name], FNP  metFORMIN  (GLUCOPHAGE ) 500 MG tablet Take 1 tablet (500 mg total) by mouth 2 (two) times daily with a meal. 12/19/23   [name], FNP  predniSONE  (STERAPRED UNI-PAK 21 TAB) 10 MG (21) TBPK tablet Use as directed. Patient not taking: Reported on 04/02/2024 01/28/24   [name], FNP  rosuvastatin  (CRESTOR ) 10 MG tablet Take 1 tablet (10 mg total) by mouth daily. 12/19/23   [name], FNP    Family History Family History  Problem Relation Age of Onset   Stroke Father    Hypertension Father    Heart attack Maternal Grandfather     Social History Social History[1]   Allergies   Tramadol   Review of Systems Review of Systems  Constitutional:  Negative for fever.  Respiratory:  Negative for cough and shortness of breath.   Cardiovascular:  Negative for chest pain and palpitations.  Musculoskeletal:  Positive for myalgias.  All other systems reviewed and are negative.    Physical Exam Triage Vital Signs ED Triage Vitals  Encounter Vitals Group     BP 04/02/24 1216 138/84     Girls Systolic BP Percentile --      Girls Diastolic BP Percentile --      Boys Systolic BP Percentile --      Boys Diastolic BP Percentile --      Pulse Rate 04/02/24 1216 76     Resp 04/02/24 1216 17     Temp 04/02/24 1216 98 F (36.7 C)     Temp src --      SpO2 04/02/24 1216 96 %     Weight 04/02/24 1215 216 lb 3.2 oz (98.1 kg)     Height --      Head Circumference --      Peak Flow --       Pain Score 04/02/24 1215 8     Pain Loc --      Pain Education --      Exclude from Growth Chart --    No data found.  Updated Vital Signs BP 138/84   Pulse 76   Temp 98 F (36.7 C)   Resp 17   Wt 216 lb 3.2 oz (98.1 kg)   SpO2 96%   BMI 31.93 kg/m   Visual Acuity Right Eye Distance:  Left Eye Distance:   Bilateral Distance:    Right Eye Near:   Left Eye Near:    Bilateral Near:     Physical Exam Vitals and nursing note reviewed.  Cardiovascular:     Rate and Rhythm: Normal rate and regular rhythm.     Heart sounds: Normal heart sounds.  Pulmonary:     Effort: Pulmonary effort is normal.     Comments: Coarse BS throughout Chest:     Chest wall: Tenderness present. No deformity.    Neurological:     General: No focal deficit present.     Mental Status: He is alert and oriented to person, place, and time.     GCS: GCS eye subscore is 4. GCS verbal subscore is 5. GCS motor subscore is 6.  Psychiatric:        Attention and Perception: Attention normal.        Mood and Affect: Mood normal.        Speech: Speech normal.        Behavior: Behavior is cooperative.      UC Treatments / Results  Labs (all labs ordered are listed, but only abnormal results are displayed) Labs Reviewed  POCT URINE DIPSTICK - Normal    EKG   Radiology DG Ribs Unilateral W/Chest Right Result Date: 04/02/2024 CLINICAL DATA:  Acute right rib pain without known injury EXAM: RIGHT RIBS AND CHEST - 3+ VIEW COMPARISON:  January 16, 2022 FINDINGS: No fracture or other bone lesions are seen involving the ribs. There is no evidence of pneumothorax or pleural effusion. Both lungs are clear. Heart size and mediastinal contours are within normal limits. IMPRESSION: Negative. Electronically Signed   By: Lynwood Landy Raddle M.D.   On: 04/02/2024 13:43    Procedures Procedures (including critical care time)  Medications Ordered in UC Medications - No data to display  Initial Impression /  Assessment and Plan / UC Course  I have reviewed the triage vital signs and the nursing notes.  Pertinent labs & imaging results that were available during my care of the patient were reviewed by me and considered in my medical decision making (see chart for details).  Clinical Course as of 04/02/24 2115  Thu Apr 02, 2024  1328 Pt to xray [JD]    Clinical Course User Index [JD] Alexandrina Fiorini, Rilla, NP   Discussed exam findings and plan of care with patient : your urine is negative Your xray is normal Take ibuprofen  and flexeril  as prescribed. May use over the counter(lidocaine  patch or gel as prescribed) May use heat or ice to area 20 minutes 3 x daily Stop smoking.  Patient verbalized understanding to this provider.  Ddx: Muscle spasm, contusion, shingles,kidney stone Final Clinical Impressions(s) / UC Diagnoses   Final diagnoses:  Rib pain on right side  Smoker  Muscle spasm     Discharge Instructions      Your urine is negative Your xray is normal Take ibuprofen  and flexeril  as prescribed. May use over the counter(lidocaine  patch or gel as prescribed) May use heat or ice to area 20 minutes 3 x daily Stop smoking.      ED Prescriptions     Medication Sig Dispense Auth. Provider   cyclobenzaprine  (FLEXERIL ) 5 MG tablet Take 1 tablet (5 mg total) by mouth 3 (three) times daily as needed for up to 5 days for muscle spasms. 15 tablet Juliauna Stueve, NP   ibuprofen  (ADVIL ) 600 MG tablet Take 1 tablet (600 mg  total) by mouth every 8 (eight) hours as needed for up to 5 days for moderate pain (pain score 4-6). 15 tablet Halsey Hammen, Rilla, NP      PDMP not reviewed this encounter.     [1]  Social History Tobacco Use   Smoking status: Every Day    Current packs/day: 1.50    Average packs/day: 1.5 packs/day for 30.0 years (45.0 ttl pk-yrs)    Types: Cigarettes   Smokeless tobacco: Never  Vaping Use   Vaping status: Never Used  Substance Use Topics   Alcohol  use: Yes    Comment: occasionally   Drug use: No     Brooks Kinnan, Rilla, NP 04/02/24 2115  "

## 2024-04-07 ENCOUNTER — Encounter: Payer: Self-pay | Admitting: Nurse Practitioner

## 2024-04-07 ENCOUNTER — Ambulatory Visit: Payer: Self-pay

## 2024-04-07 ENCOUNTER — Ambulatory Visit: Admitting: Nurse Practitioner

## 2024-04-07 VITALS — BP 150/80 | HR 80 | Temp 98.0°F | Ht 69.0 in | Wt 216.0 lb

## 2024-04-07 DIAGNOSIS — J40 Bronchitis, not specified as acute or chronic: Secondary | ICD-10-CM

## 2024-04-07 MED ORDER — BENZONATATE 100 MG PO CAPS: 200.0000 mg | ORAL_CAPSULE | Freq: Two times a day (BID) | ORAL | 0 refills | Status: AC | PRN

## 2024-04-07 MED ORDER — PROMETHAZINE-DM 6.25-15 MG/5ML PO SYRP: 5.0000 mL | ORAL_SOLUTION | Freq: Four times a day (QID) | ORAL | 0 refills | Status: AC | PRN

## 2024-04-07 MED ORDER — AZITHROMYCIN 250 MG PO TABS: ORAL_TABLET | ORAL | 0 refills | Status: AC

## 2024-04-07 MED ORDER — PREDNISONE 20 MG PO TABS: 40.0000 mg | ORAL_TABLET | Freq: Every day | ORAL | 0 refills | Status: AC

## 2024-04-07 MED ORDER — ALBUTEROL SULFATE HFA 108 (90 BASE) MCG/ACT IN AERS: 2.0000 | INHALATION_SPRAY | Freq: Four times a day (QID) | RESPIRATORY_TRACT | 0 refills | Status: AC | PRN

## 2024-04-07 NOTE — Telephone Encounter (Signed)
 FYI Only or Action Required?: FYI only for provider: appointment scheduled on 04/07/24.  Patient was last seen in primary care on 03/20/2024 by [name], FNP.  Called Nurse Triage reporting Cough.  Symptoms began 10 days ago.  Interventions attempted: OTC medications: Mucinex.  Symptoms are: unchanged.  Triage Disposition: See HCP Within 4 Hours (Or PCP Triage)  Patient/caregiver understands and will follow disposition?: Yes   Summary: Worsening cough, congestion   Reason for Triage: Wife calling to report patient with worsening cough and congestion symptoms. Bright green mucus from nose and cough. Body aches, possible fever. History of bronchitis. Patient had to go work. Patient Ethan George) CB# [phone]       Reason for Disposition  [1] MILD difficulty breathing (e.g., minimal/no SOB at rest, SOB with walking, pulse < 100) AND [2] still present when not coughing  Answer Assessment - Initial Assessment Questions Patient called and he says he's been having sinus and chest congestion over a week, went to the UC, had chest xray-neg for pneumon on last Thursday. Head congestion-bright green, yellow mucus and the same when coughing, drainage down throat at night. Reports wheezing, SOB w/movement and at rest, body aches, denies fever. Taking Mucinex OTC. Scheduled for today.    1. ONSET: When did the cough begin?      Over a week ago  2. SEVERITY: How bad is the cough today?      6  3. SPUTUM: Describe the color of your sputum (e.g., none, dry cough; clear, white, yellow, green)     Green, yellow, thick  4. HEMOPTYSIS: Are you coughing up any blood? If Yes, ask: How much? (e.g., flecks, streaks, tablespoons, etc.)     No  5. DIFFICULTY BREATHING: Are you having difficulty breathing? If Yes, ask: How bad is it? (e.g., mild, moderate, severe)      Yes, mild-moderate  6. FEVER: Do you have a fever? If Yes, ask: What is your temperature, how was it measured,  and when did it start?     Felt warm this morning, did not check  7. CARDIAC HISTORY: Do you have any history of heart disease? (e.g., heart attack, congestive heart failure)      No  8. LUNG HISTORY: Do you have any history of lung disease?  (e.g., pulmonary embolus, asthma, emphysema)     Asthma     10. OTHER SYMPTOMS: Do you have any other symptoms? (e.g., runny nose, wheezing, chest pain)       Nasal congestion w/green, yellow mucus  Protocols used: Cough - Acute Productive-A-AH

## 2024-04-07 NOTE — Progress Notes (Signed)
 "  BP (!) 150/80   Pulse 80   Temp 98 F (36.7 C)   Ht 5' 9 (1.753 m)   Wt 216 lb (98 kg)   SpO2 97%   BMI 31.90 kg/m    Subjective:    Patient ID: Ethan George, male    DOB: 02/28/1976, 49 y.o.   MRN: [id number]  HPI: Ethan George is a 49 y.o. male  Chief Complaint  Patient presents with   Cough    Pt c/o cough/ congestion x9 days.    Discussed the use of AI scribe software for clinical note transcription with the patient, who gave verbal consent to proceed.  History of Present Illness Ethan George is a 49 year old male who presents with cough and congestion for over ten days.  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  Relevant medical and social history - History of bronchitis - Current smoker - No known allergies         12/18/2023    2:06 PM 07/24/2021    2:56 PM 07/14/2021    1:52 PM  Depression screen PHQ 2/9  Decreased Interest 0 1 0  Down, Depressed, Hopeless 0 0 0  PHQ - 2 Score 0 1 0  Altered sleeping  3 3  Tired, decreased energy  1 1  Change in appetite  0 0  Feeling bad or failure about yourself   0 0  Trouble concentrating  2 2  Moving slowly or fidgety/restless  1 0  Suicidal thoughts  0 0  PHQ-9 Score  8  6   Difficult doing work/chores  Somewhat difficult Very difficult     Data saved with a previous flowsheet row definition    Relevant past medical, surgical, family and social history reviewed and updated as indicated. Interim medical  history since our last visit reviewed. Allergies and medications reviewed and updated.  Review of Systems  Ten systems reviewed and is negative except as mentioned in HPI      Objective:      BP (!) 150/80   Pulse 80   Temp 98 F (36.7 C)   Ht 5' 9 (1.753 m)   Wt 216 lb (98 kg)   SpO2 97%   BMI 31.90 kg/m    Wt Readings from Last 3 Encounters:  04/07/24 216 lb (98 kg)  04/02/24 216 lb 3.2 oz (98.1 kg)  03/20/24 216 lb (98 kg)    Physical Exam GENERAL: Alert, cooperative, well developed, no acute distress. HEENT: Normocephalic, normal oropharynx, moist mucous membranes. CHEST:wheezing and rhonchi CARDIOVASCULAR: Normal heart rate and rhythm, S1 and S2 normal without murmurs. ABDOMEN: Soft, non-tender, non-distended, without organomegaly, normal bowel sounds. EXTREMITIES: No cyanosis or edema. NEUROLOGICAL: Cranial nerves grossly intact, moves all extremities without gross motor or sensory deficit.  Results for orders placed or performed during the hospital encounter of 04/02/24  POC Urinalysis Dipstick   Collection Time: 04/02/24 12:32 PM  Result Value Ref Range   Color,  UA yellow yellow   Clarity, UA clear clear   Glucose, UA negative negative mg/dL   Bilirubin, UA negative negative   Ketones, POC UA negative negative mg/dL   Spec Grav, UA 8.989 8.989 - 1.025   Blood, UA negative negative   pH, UA 5.5 5.0 - 8.0   Protein Ur, POC negative negative mg/dL   Urobilinogen, UA 0.2 0.2 or 1.0 E.U./dL   Nitrite, UA Negative Negative   Leukocytes, UA Negative Negative          Assessment & Plan:   Problem List Items Addressed This Visit   None Visit Diagnoses       Bronchitis    -  Primary   Relevant Medications   albuterol  (VENTOLIN  HFA) 108 (90 Base) MCG/ACT inhaler   promethazine -dextromethorphan (PROMETHAZINE -DM) 6.25-15 MG/5ML syrup   benzonatate  (TESSALON ) 100 MG capsule   azithromycin  (ZITHROMAX ) 250 MG tablet   predniSONE  (DELTASONE ) 20 MG tablet         Assessment and Plan Assessment & Plan Acute bronchitis Cough and congestion for over ten days with bright green mucus, body aches, and a history of bronchitis. Recent rib x-ray showed clear lungs, but clinical presentation suggests atypical pneumonia or bronchitis. Lungs sound poor on examination. - Prescribed azithromycin  for atypical pneumonia/bronchitis. - Prescribed prednisone  taper for five days. - Prescribed albuterol  inhaler. - Prescribed Tessalon  Perles for cough suppression. - Advised to continue Mucinex twice a day. - Recommended over-the-counter allergy pills like Zyrtec or Claritin. - Recommended Flonase  nasal spray. - Advised to avoid dairy to prevent thicker mucus. - Encouraged increased water intake to prevent dehydration. - Advised to stay home for 24-48 hours to allow antibiotics to take effect. - Provided work note for return on Friday.  Tobacco use disorder Current smoker, which may exacerbate respiratory symptoms and complicate recovery from bronchitis.        Follow up plan: Return if symptoms worsen or fail to improve. "

## 2024-04-14 ENCOUNTER — Ambulatory Visit: Payer: Self-pay

## 2024-04-14 NOTE — Telephone Encounter (Signed)
 FYI Only or Action Required?: FYI only for provider: appointment scheduled on 2/4.  Patient was last seen in primary care on 04/07/2024 by Ethan Mliss FALCON, FNP.  Called Nurse Triage reporting Cough.  Symptoms began 10 days ago.  Interventions attempted: OTC medications: Mucinex and Prescription medications: Prednisone , zithromax , tessalon , promethazine , ventolin .  Symptoms are: unchanged.  Triage Disposition: See Physician Within 24 Hours  Patient/caregiver understands and will follow disposition?: Yes      Spoke with pts wife Ethan George (on [place]). Just finished abxs for bronchitis after OV 1 week ago. Severe cough with green mucus and chest congestion persists. No SOB or CP. Unsure if he has a fever. Reports symptoms have not improved despite abx, prednisone , tessalon  perles, promethazine  and ventolin . Scheduled appt with PCP tomorrow. Advised UC or ED for worsening symptoms.      Message from Victoria B sent at 04/14/2024 10:44 AM EST  Reason for Triage: patient has wheezing cough, chest congestion, coughs up green , thick mucus, still happening after taking antibiotics   Reason for Disposition  SEVERE coughing spells (e.g., whooping sound after coughing, vomiting after coughing)  Answer Assessment - Initial Assessment Questions 1. ONSET: When did the cough begin?      10 days ago  2. SEVERITY: How bad is the cough today?      Severe when laying down and talking  3. SPUTUM: Describe the color of your sputum (e.g., none, dry cough; clear, white, yellow, green)     Green  4. HEMOPTYSIS: Are you coughing up any blood? If Yes, ask: How much? (e.g., flecks, streaks, tablespoons, etc.)     No  5. DIFFICULTY BREATHING: Are you having difficulty breathing? If Yes, ask: How bad is it? (e.g., mild, moderate, severe)      No  6. FEVER: Do you have a fever? If Yes, ask: What is your temperature, how was it measured, and when did it start?     Unsure  7. CARDIAC  HISTORY: Do you have any history of heart disease? (e.g., heart attack, congestive heart failure)      No  8. LUNG HISTORY: Do you have any history of lung disease?  (e.g., pulmonary embolus, asthma, emphysema)     Chronic bronchitis  9. PE RISK FACTORS: Do you have a history of blood clots? (or: recent major surgery, recent prolonged travel, bedridden)     No  10. OTHER SYMPTOMS: Do you have any other symptoms? (e.g., runny nose, wheezing, chest pain)       Chest congestion  Protocols used: Cough - Acute Productive-A-AH

## 2024-04-15 ENCOUNTER — Other Ambulatory Visit: Payer: Self-pay | Admitting: Nurse Practitioner

## 2024-04-15 ENCOUNTER — Ambulatory Visit
Admission: RE | Admit: 2024-04-15 | Discharge: 2024-04-15 | Disposition: A | Source: Home / Self Care | Attending: Nurse Practitioner | Admitting: Nurse Practitioner

## 2024-04-15 ENCOUNTER — Ambulatory Visit: Admitting: Nurse Practitioner

## 2024-04-15 ENCOUNTER — Encounter: Payer: Self-pay | Admitting: Nurse Practitioner

## 2024-04-15 ENCOUNTER — Ambulatory Visit
Admission: RE | Admit: 2024-04-15 | Discharge: 2024-04-15 | Disposition: A | Source: Ambulatory Visit | Attending: Nurse Practitioner

## 2024-04-15 VITALS — BP 130/82 | HR 74 | Temp 97.4°F | Ht 69.0 in | Wt 216.0 lb

## 2024-04-15 DIAGNOSIS — R053 Chronic cough: Secondary | ICD-10-CM

## 2024-04-15 DIAGNOSIS — J40 Bronchitis, not specified as acute or chronic: Secondary | ICD-10-CM

## 2024-04-15 DIAGNOSIS — F17200 Nicotine dependence, unspecified, uncomplicated: Secondary | ICD-10-CM

## 2024-04-15 MED ORDER — TRELEGY ELLIPTA 200-62.5-25 MCG/ACT IN AEPB: 1.0000 | INHALATION_SPRAY | Freq: Every day | RESPIRATORY_TRACT | 1 refills | Status: AC

## 2024-04-15 NOTE — Progress Notes (Signed)
 "  BP 130/82   Pulse 74   Temp (!) 97.4 F (36.3 C)   Ht 5' 9 (1.753 m)   Wt 216 lb (98 kg)   SpO2 97%   BMI 31.90 kg/m    Subjective:    Patient ID: Ethan George, male    DOB: 10-26-1975, 49 y.o.   MRN: [id number]  HPI: Ethan George is a 49 y.o. male  Chief Complaint  Patient presents with   Cough   Discussed the use of AI scribe software for clinical note transcription with the patient, who gave verbal consent to proceed.  History of Present Illness Ethan George is a 49 year old male who presents with a persistent cough and chest congestion.  Cough and chest congestion - Persistent cough with green mucus and chest congestion - Symptoms have not improved despite completing a course of antibiotics for bronchitis - No shortness of breath, chest pain, or fever - Previously treated with albuterol  inhaler, promethazine  DM, Tessalon  Perles, azithromycin , and prednisone   Imaging and diagnostic findings - Rib x-ray on April 02, 2024 indicated clear lungs - Recent chest x-ray showed no pneumonia or fluid EXAM: CHEST - 2 VIEW   COMPARISON:  04/02/2024   FINDINGS: Normal heart size and vascularity. Chronic central bronchitic changes and slight diffuse interstitial prominence. Symmetric nipple shadows noted. No acute pneumonia, collapse or consolidation. Negative for edema, effusion or pneumothorax. Trachea midline. No acute osseous finding.   IMPRESSION: Chronic bronchitic changes. No acute chest process.  Tobacco use - Smoker    Note from previous visit on 04/07/2024  Acute bronchitis Cough and congestion for over ten days with bright green mucus, body aches, and a history of bronchitis. Recent rib x-ray showed clear lungs, but clinical presentation suggests atypical pneumonia or bronchitis. Lungs sound poor on examination. - Prescribed azithromycin  for atypical pneumonia/bronchitis. - Prescribed prednisone  taper for five days. - Prescribed albuterol   inhaler. - Prescribed Tessalon  Perles for cough suppression. - Advised to continue Mucinex twice a day. - Recommended over-the-counter allergy pills like Zyrtec or Claritin. - Recommended Flonase  nasal spray.      12/18/2023    2:06 PM 07/24/2021    2:56 PM 07/14/2021    1:52 PM  Depression screen PHQ 2/9  Decreased Interest 0 1 0  Down, Depressed, Hopeless 0 0 0  PHQ - 2 Score 0 1 0  Altered sleeping  3 3  Tired, decreased energy  1 1  Change in appetite  0 0  Feeling bad or failure about yourself   0 0  Trouble concentrating  2 2  Moving slowly or fidgety/restless  1 0  Suicidal thoughts  0 0  PHQ-9 Score  8  6   Difficult doing work/chores  Somewhat difficult Very difficult     Data saved with a previous flowsheet row definition    Relevant past medical, surgical, family and social history reviewed and updated as indicated. Interim medical history since our last visit reviewed. Allergies and medications reviewed and updated.  Review of Systems  Ten systems reviewed and is negative except as mentioned in HPI      Objective:      BP 130/82   Pulse 74   Temp (!) 97.4 F (36.3 C)   Ht 5' 9 (1.753 m)   Wt 216 lb (98 kg)   SpO2 97%   BMI 31.90 kg/m    Wt Readings from Last 3 Encounters:  04/15/24 216 lb (98 kg)  04/07/24 216 lb (98 kg)  04/02/24 216 lb 3.2 oz (98.1 kg)    Physical Exam GENERAL: Alert, cooperative, well developed, no acute distress. HEENT: Normocephalic, normal oropharynx, moist mucous membranes. CHEST: Wheezing present. CARDIOVASCULAR: Normal heart rate and rhythm, S1 and S2 normal without murmurs. ABDOMEN: Soft, non-tender, non-distended, without organomegaly, normal bowel sounds. EXTREMITIES: No cyanosis or edema. NEUROLOGICAL: Cranial nerves grossly intact, moves all extremities without gross motor or sensory deficit.  Results for orders placed or performed during the hospital encounter of 04/02/24  POC Urinalysis Dipstick   Collection  Time: 04/02/24 12:32 PM  Result Value Ref Range   Color, UA yellow yellow   Clarity, UA clear clear   Glucose, UA negative negative mg/dL   Bilirubin, UA negative negative   Ketones, POC UA negative negative mg/dL   Spec Grav, UA 8.989 8.989 - 1.025   Blood, UA negative negative   pH, UA 5.5 5.0 - 8.0   Protein Ur, POC negative negative mg/dL   Urobilinogen, UA 0.2 0.2 or 1.0 E.U./dL   Nitrite, UA Negative Negative   Leukocytes, UA Negative Negative          Assessment & Plan:   Problem List Items Addressed This Visit       Other   Smoker   Relevant Medications   Fluticasone -Umeclidin-Vilant (TRELEGY ELLIPTA ) 200-62.5-25 MCG/ACT AEPB   Other Relevant Orders   Ambulatory referral to Pulmonology   Other Visit Diagnoses       Bronchitis    -  Primary   Relevant Medications   Fluticasone -Umeclidin-Vilant (TRELEGY ELLIPTA ) 200-62.5-25 MCG/ACT AEPB   Other Relevant Orders   Ambulatory referral to Pulmonology        Assessment and Plan Assessment & Plan Chronic bronchitis (suspected COPD) Persistent cough with green mucus and chest congestion. No shortness of breath or fever. Chest x-ray shows chronic central bronchitic changes, no pneumonia or fluid. Suspected COPD due to smoking history. Wheezing present on lung examination. No current infection or pneumonia. - Started Trelegy inhaler daily, instructed to rinse mouth after use to prevent thrush. - Continue albuterol  inhaler as needed for rescue. - Referred to pulmonology for pulmonary function test and further evaluation. - Advised to cut back on smoking to reduce inflammation and prevent exacerbations. - Provided sample of Trelegy and sent prescription to pharmacy.  Nicotine dependence Continued smoking contributing to respiratory symptoms and potential COPD progression. - Advised to cut back on smoking to improve respiratory health and prevent exacerbations.        Follow up plan: Return if symptoms worsen or  fail to improve. "

## 2024-04-28 ENCOUNTER — Ambulatory Visit: Admitting: Pulmonary Disease

## 2024-06-19 ENCOUNTER — Ambulatory Visit: Admitting: Nurse Practitioner
